# Patient Record
Sex: Female | Born: 1980 | Race: Black or African American | Hispanic: No | Marital: Married | Smoking: Never smoker
Health system: Southern US, Community
[De-identification: ages and names within clinical notes are randomized; demographics above are authoritative.]

## PROBLEM LIST (undated history)

## (undated) ENCOUNTER — Inpatient Hospital Stay (HOSPITAL_COMMUNITY): Payer: Self-pay

## (undated) DIAGNOSIS — R011 Cardiac murmur, unspecified: Secondary | ICD-10-CM

## (undated) DIAGNOSIS — IMO0002 Reserved for concepts with insufficient information to code with codable children: Secondary | ICD-10-CM

## (undated) DIAGNOSIS — Z789 Other specified health status: Secondary | ICD-10-CM

## (undated) DIAGNOSIS — G43709 Chronic migraine without aura, not intractable, without status migrainosus: Secondary | ICD-10-CM

## (undated) DIAGNOSIS — H579 Unspecified disorder of eye and adnexa: Secondary | ICD-10-CM

## (undated) HISTORY — PX: NO PAST SURGERIES: SHX2092

---
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
## 2001-11-29 ENCOUNTER — Emergency Department (HOSPITAL_COMMUNITY): Admission: EM | Admit: 2001-11-29 | Discharge: 2001-11-29 | Payer: Self-pay | Admitting: Emergency Medicine

## 2002-10-29 ENCOUNTER — Emergency Department (HOSPITAL_COMMUNITY): Admission: EM | Admit: 2002-10-29 | Discharge: 2002-10-29 | Payer: Self-pay | Admitting: Emergency Medicine

## 2004-01-06 ENCOUNTER — Emergency Department (HOSPITAL_COMMUNITY): Admission: EM | Admit: 2004-01-06 | Discharge: 2004-01-06 | Payer: Self-pay | Admitting: Emergency Medicine

## 2005-02-11 ENCOUNTER — Emergency Department (HOSPITAL_COMMUNITY): Admission: EM | Admit: 2005-02-11 | Discharge: 2005-02-11 | Payer: Self-pay | Admitting: Emergency Medicine

## 2005-02-15 ENCOUNTER — Emergency Department (HOSPITAL_COMMUNITY): Admission: EM | Admit: 2005-02-15 | Discharge: 2005-02-15 | Payer: Self-pay | Admitting: Emergency Medicine

## 2005-02-18 ENCOUNTER — Emergency Department (HOSPITAL_COMMUNITY): Admission: EM | Admit: 2005-02-18 | Discharge: 2005-02-18 | Payer: Self-pay | Admitting: Family Medicine

## 2005-05-29 ENCOUNTER — Emergency Department (HOSPITAL_COMMUNITY): Admission: EM | Admit: 2005-05-29 | Discharge: 2005-05-29 | Payer: Self-pay | Admitting: Family Medicine

## 2007-03-18 ENCOUNTER — Emergency Department (HOSPITAL_COMMUNITY): Admission: EM | Admit: 2007-03-18 | Discharge: 2007-03-18 | Payer: Self-pay | Admitting: *Deleted

## 2010-06-28 ENCOUNTER — Emergency Department (HOSPITAL_COMMUNITY): Admission: EM | Admit: 2010-06-28 | Discharge: 2010-06-29 | Payer: Self-pay | Admitting: Emergency Medicine

## 2010-06-29 ENCOUNTER — Emergency Department (HOSPITAL_COMMUNITY): Admission: EM | Admit: 2010-06-29 | Discharge: 2010-06-30 | Payer: Self-pay | Admitting: Emergency Medicine

## 2012-05-15 ENCOUNTER — Encounter (HOSPITAL_COMMUNITY): Payer: Self-pay | Admitting: Family Medicine

## 2012-05-15 ENCOUNTER — Emergency Department (HOSPITAL_COMMUNITY)
Admission: EM | Admit: 2012-05-15 | Discharge: 2012-05-15 | Disposition: A | Discharge status: Home / Self Care | Payer: Self-pay | Attending: Emergency Medicine | Admitting: Emergency Medicine

## 2012-05-15 DIAGNOSIS — R112 Nausea with vomiting, unspecified: Secondary | ICD-10-CM | POA: Insufficient documentation

## 2012-05-15 DIAGNOSIS — R109 Unspecified abdominal pain: Secondary | ICD-10-CM

## 2012-05-15 DIAGNOSIS — N390 Urinary tract infection, site not specified: Secondary | ICD-10-CM

## 2012-05-15 DIAGNOSIS — R630 Anorexia: Secondary | ICD-10-CM | POA: Insufficient documentation

## 2012-05-15 DIAGNOSIS — B3741 Candidal cystitis and urethritis: Secondary | ICD-10-CM

## 2012-05-15 DIAGNOSIS — R1013 Epigastric pain: Secondary | ICD-10-CM | POA: Insufficient documentation

## 2012-05-15 DIAGNOSIS — R1012 Left upper quadrant pain: Secondary | ICD-10-CM | POA: Insufficient documentation

## 2012-05-15 LAB — COMPREHENSIVE METABOLIC PANEL
ALT: 11 U/L (ref 0–35)
Alkaline Phosphatase: 70 U/L (ref 39–117)
CO2: 26 mEq/L (ref 19–32)
Chloride: 103 mEq/L (ref 96–112)
GFR calc Af Amer: >90 mL/min (ref >90)
Glucose, Bld: 76 mg/dL (ref 70–99)
Potassium: 4.2 mEq/L (ref 3.5–5.1)
Sodium: 138 mEq/L (ref 135–145)
Total Bilirubin: 0.3 mg/dL (ref 0.3–1.2)
Total Protein: 7.7 g/dL (ref 6.0–8.3)

## 2012-05-15 LAB — CBC WITH DIFFERENTIAL/PLATELET
Eosinophils Absolute: 0.2 10*3/uL (ref 0.0–0.7)
Hemoglobin: 13.9 g/dL (ref 12.0–15.0)
Lymphocytes Relative: 39 % (ref 12–46)
Lymphs Abs: 2.2 10*3/uL (ref 0.7–4.0)
Monocytes Relative: 9 % (ref 3–12)
Neutro Abs: 2.7 10*3/uL (ref 1.7–7.7)
Neutrophils Relative %: 48 % (ref 43–77)
Platelets: 272 10*3/uL (ref 150–400)
RBC: 4.35 MIL/uL (ref 3.87–5.11)
WBC: 5.6 10*3/uL (ref 4.0–10.5)

## 2012-05-15 LAB — URINE MICROSCOPIC-ADD ON

## 2012-05-15 LAB — WET PREP, GENITAL

## 2012-05-15 LAB — URINALYSIS, ROUTINE W REFLEX MICROSCOPIC
Glucose, UA: NEGATIVE mg/dL
Ketones, ur: 40 mg/dL — AB
Protein, ur: >300 mg/dL — AB
Urobilinogen, UA: 1 mg/dL (ref 0.0–1.0)

## 2012-05-15 MED ORDER — ONDANSETRON HCL 4 MG PO TABS: 4.0000 mg | ORAL_TABLET | Freq: Four times a day (QID) | ORAL | Status: DC

## 2012-05-15 MED ORDER — NITROFURANTOIN MONOHYD MACRO 100 MG PO CAPS: 100.0000 mg | ORAL_CAPSULE | Freq: Two times a day (BID) | ORAL | Status: DC

## 2012-05-15 MED ORDER — NITROFURANTOIN MONOHYD MACRO 100 MG PO CAPS: 100.0000 mg | ORAL_CAPSULE | Freq: Two times a day (BID) | ORAL | Status: AC

## 2012-05-15 MED ORDER — FLUCONAZOLE 150 MG PO TABS
150.0000 mg | ORAL_TABLET | Freq: Once | ORAL | Status: AC
  Administered 2012-05-15: 150 mg via ORAL
  Filled 2012-05-15: qty 1

## 2012-05-15 MED ORDER — ONDANSETRON HCL 4 MG PO TABS: 4.0000 mg | ORAL_TABLET | Freq: Four times a day (QID) | ORAL | Status: AC

## 2012-05-15 NOTE — ED Notes (Signed)
Pt sts abdominal pain since this am. sts located at the top of her abdomen associated with N,V. sts hasnt had a normal period in a few months.

## 2012-05-15 NOTE — ED Notes (Signed)
Discharge instructions and prescriptions given  Voiced understanding. 

## 2012-05-15 NOTE — ED Provider Notes (Signed)
History     CSN: [id number]  Arrival date & time 05/15/12  1315   First MD Initiated Contact with Patient 05/15/12 1727      Chief Complaint  Patient presents with  . Abdominal Pain    (Consider location/radiation/quality/duration/timing/severity/associated sxs/prior treatment) Patient is a 31 y.o. female presenting with abdominal pain.  Abdominal Pain The primary symptoms of the illness include abdominal pain, nausea, vomiting and vaginal bleeding. The primary symptoms of the illness do not include diarrhea, dysuria or vaginal discharge. The current episode started yesterday. The onset of the illness was gradual. The problem has not changed since onset. The abdominal pain is located in the LUQ and epigastric region. The abdominal pain does not radiate. The severity of the abdominal pain is 6/10. The abdominal pain is relieved by nothing. The abdominal pain is exacerbated by eating.  Associated with: no EtOH use. The patient states that she believes she is currently not pregnant. The patient has not had a change in bowel habit. Additional symptoms associated with the illness include anorexia. Symptoms associated with the illness do not include chills, constipation, urgency, frequency or back pain. Significant associated medical issues do not include GERD, diabetes or gallstones.    History reviewed. No pertinent past medical history.  History reviewed. No pertinent past surgical history.  History reviewed. No pertinent family history.  History  Substance Use Topics  . Smoking status: Not on file  . Smokeless tobacco: Not on file  . Alcohol Use:     OB History    Grav Para Term Preterm Abortions TAB SAB Ect Mult Living                  Review of Systems  Constitutional: Negative for chills.  Gastrointestinal: Positive for nausea, vomiting, abdominal pain and anorexia. Negative for diarrhea and constipation.  Genitourinary: Positive for vaginal bleeding. Negative for  dysuria, urgency, frequency and vaginal discharge.  Musculoskeletal: Negative for back pain.  All other systems reviewed and are negative.    Allergies  Penicillins  Home Medications  No current outpatient prescriptions on file.  BP 106/61  Pulse 76  Temp 98 F (36.7 C) (Oral)  Resp 15  SpO2 99%  LMP 05/15/2012  Physical Exam  Nursing note and vitals reviewed. Constitutional: She is oriented to person, place, and time. She appears well-developed and well-nourished. No distress.  HENT:  Head: Normocephalic and atraumatic.  Mouth/Throat: Oropharynx is clear and moist.  Eyes: Conjunctivae and EOM are normal. Pupils are equal, round, and reactive to light.  Neck: Normal range of motion. Neck supple.  Cardiovascular: Normal rate, regular rhythm and intact distal pulses.   No murmur heard. Pulmonary/Chest: Effort normal and breath sounds normal. No respiratory distress. She has no wheezes. She has no rales.  Abdominal: Soft. Normal appearance. She exhibits no distension. There is tenderness in the epigastric area and left upper quadrant. There is no rebound, no guarding and no CVA tenderness. No hernia.    Genitourinary: Uterus normal. Cervix exhibits no motion tenderness, no discharge and no friability. Right adnexum displays no mass and no tenderness. Left adnexum displays no mass and no tenderness. There is bleeding around the vagina. No tenderness around the vagina. No vaginal discharge found.  Musculoskeletal: Normal range of motion. She exhibits no edema and no tenderness.  Neurological: She is alert and oriented to person, place, and time.  Skin: Skin is warm and dry. No rash noted. No erythema.  Psychiatric: She has a normal mood  and affect. Her behavior is normal.    ED Course  Procedures (including critical care time)  Labs Reviewed  URINALYSIS, ROUTINE W REFLEX MICROSCOPIC - Abnormal; Notable for the following:    Color, Urine RED (*)  BIOCHEMICALS MAY BE AFFECTED  BY COLOR   APPearance TURBID (*)     Hgb urine dipstick LARGE (*)     Bilirubin Urine LARGE (*)     Ketones, ur 40 (*)     Protein, ur >300 (*)     Nitrite POSITIVE (*)     Leukocytes, UA MODERATE (*)     All other components within normal limits  URINE MICROSCOPIC-ADD ON - Abnormal; Notable for the following:    Squamous Epithelial / LPF MANY (*)     Bacteria, UA MANY (*)     All other components within normal limits  COMPREHENSIVE METABOLIC PANEL - Abnormal; Notable for the following:    GFR calc non Af Amer 79 (*)     All other components within normal limits  WET PREP, GENITAL - Abnormal; Notable for the following:    Yeast Wet Prep HPF POC FEW (*)     Clue Cells Wet Prep HPF POC FEW (*)     All other components within normal limits  POCT PREGNANCY, URINE  CBC WITH DIFFERENTIAL  LIPASE, BLOOD  GC/CHLAMYDIA PROBE AMP, GENITAL   No results found.   No diagnosis found.    MDM   Patient with a history of left-sided abdominal pain that started last night. She denies any dysuria but also started her menses yesterday. She's had 2 episodes of vomiting and nausea however the nausea starting to improve. On exam she has left mid and upper quadrant pain. There's no suprapubic tenderness or CVA tenderness. He is contaminated but has positive nitrites and leukocytes as well as positive yeast. Will get CBC, CMP and lipase. Patient states she does not drink alcohol heavily and has not had any alcohol in the last 36 hours. Also will do pelvic exam to further evaluate for pelvic infection. UPT is negative  Labs all wnl.  Wet prep neg.  No CMT or adnexal tenderness on exam.  Due to yeast and infection in the urine will treat.      Gwyneth Sprout, MD 05/15/12 1840

## 2012-05-16 LAB — GC/CHLAMYDIA PROBE AMP, GENITAL: Chlamydia, DNA Probe: NEGATIVE

## 2013-04-01 ENCOUNTER — Emergency Department (HOSPITAL_COMMUNITY)
Admission: EM | Admit: 2013-04-01 | Discharge: 2013-04-01 | Disposition: A | Discharge status: Home / Self Care | Payer: Self-pay | Attending: Emergency Medicine | Admitting: Emergency Medicine

## 2013-04-01 ENCOUNTER — Encounter (HOSPITAL_COMMUNITY): Payer: Self-pay | Admitting: Emergency Medicine

## 2013-04-01 DIAGNOSIS — K0889 Other specified disorders of teeth and supporting structures: Secondary | ICD-10-CM

## 2013-04-01 DIAGNOSIS — K089 Disorder of teeth and supporting structures, unspecified: Secondary | ICD-10-CM | POA: Insufficient documentation

## 2013-04-01 DIAGNOSIS — Z880 Allergy status to penicillin: Secondary | ICD-10-CM | POA: Insufficient documentation

## 2013-04-01 MED ORDER — HYDROCODONE-ACETAMINOPHEN 5-325 MG PO TABS
ORAL_TABLET | ORAL | Status: DC
Start: 2013-04-01 — End: 2013-07-16

## 2013-04-01 MED ORDER — IBUPROFEN 600 MG PO TABS: 600.0000 mg | ORAL_TABLET | Freq: Four times a day (QID) | ORAL | Status: DC | PRN

## 2013-04-01 NOTE — ED Notes (Signed)
Pt c/o right sided dental pain x 1 week

## 2013-04-01 NOTE — ED Provider Notes (Signed)
History     CSN: [id number]  Arrival date & time 04/01/13  0809   First MD Initiated Contact with Patient 04/01/13 0915      Chief Complaint  Patient presents with  . Dental Pain    (Consider location/radiation/quality/duration/timing/severity/associated sxs/prior treatment) HPI Comments: Patient presents with one-week history of upper and lower dental pain on the left side of her mouth. No neck swelling or fever. No trouble with chewing or swallowing. Cold water makes the pain worse. Nothing makes it better. Patient has tried several over-the-counter medications. Onset gradual. Course is constant.  Patient is a 32 y.o. female presenting with tooth pain. The history is provided by the patient.  Dental PainPrimary symptoms do not include headaches, fever, shortness of breath or sore throat.  Additional symptoms do not include: facial swelling, trouble swallowing and ear pain.    History reviewed. No pertinent past medical history.  History reviewed. No pertinent past surgical history.  History reviewed. No pertinent family history.  History  Substance Use Topics  . Smoking status: Never Smoker   . Smokeless tobacco: Not on file  . Alcohol Use: Yes     Comment: occ    OB History   Grav Para Term Preterm Abortions TAB SAB Ect Mult Living                  Review of Systems  Constitutional: Negative for fever.  HENT: Positive for dental problem. Negative for ear pain, sore throat, facial swelling, trouble swallowing and neck pain.   Respiratory: Negative for shortness of breath and stridor.   Skin: Negative for color change.  Neurological: Negative for headaches.    Allergies  Penicillins  Home Medications   Current Outpatient Rx  Name  Route  Sig  Dispense  Refill  . acetaminophen (TYLENOL) 500 MG tablet   Oral   Take 1,000 mg by mouth every 6 (six) hours as needed for pain.         . Aspirin-Acetaminophen-Caffeine (GOODYS EXTRA STRENGTH PO)   Oral   Take  1 packet by mouth 2 (two) times daily as needed (pain).         . naproxen sodium (ANAPROX) 220 MG tablet   Oral   Take 220 mg by mouth once as needed (pain).         Marland Kitchen HYDROcodone-acetaminophen (NORCO/VICODIN) 5-325 MG per tablet      Take 1-2 tablets every 6 hours as needed for severe pain   12 tablet   0   . ibuprofen (ADVIL,MOTRIN) 600 MG tablet   Oral   Take 1 tablet (600 mg total) by mouth every 6 (six) hours as needed for pain.   20 tablet   0     BP 147/85  Pulse 50  Resp 20  SpO2 100%  Physical Exam  Nursing note and vitals reviewed. Constitutional: She appears well-developed and well-nourished.  HENT:  Head: Normocephalic and atraumatic. No trismus in the jaw.  Right Ear: Tympanic membrane, external ear and ear canal normal.  Left Ear: Tympanic membrane, external ear and ear canal normal.  Nose: Nose normal.  Mouth/Throat: Uvula is midline, oropharynx is clear and moist and mucous membranes are normal. Abnormal dentition. Dental caries present. No dental abscesses or edematous. No tonsillar abscesses.  No swelling or erythema noted on exam. Pain with palpation at base of upper right third molar. Lower right third molar is absent however there is tenderness in this area as well. No gross abscess.  Eyes: Conjunctivae are normal.  Neck: Normal range of motion. Neck supple.  No neck swelling or Ludwig's angina  Lymphadenopathy:    She has no cervical adenopathy.  Neurological: She is alert.  Skin: Skin is warm and dry.  Psychiatric: She has a normal mood and affect.    ED Course  Procedures (including critical care time)  Labs Reviewed - No data to display No results found.   1. Pain, dental    9:59 AM Patient seen and examined.    Vital signs reviewed and are as follows: Filed Vitals:   04/01/13 0825  BP: 147/85  Pulse: 50  Resp: 20    Patient counseled to take prescribed medications as directed, return with worsening facial or neck swelling,  and to follow-up with their dentist as soon as possible.   Patient counseled on use of narcotic pain medications. Counseled not to combine these medications with others containing tylenol. Urged not to drink alcohol, drive, or perform any other activities that requires focus while taking these medications. The patient verbalizes understanding and agrees with the plan.    MDM  Patient with toothache.  No gross abscess.  Exam unconcerning for Ludwig's angina or other deep tissue infection in neck.  Will treat with pain medicine.  Urged patient to follow-up with dentist.  Referrals given.          Renne Crigler, PA-C 04/01/13 1001

## 2013-04-01 NOTE — ED Provider Notes (Signed)
Medical screening examination/treatment/procedure(s) were performed by non-physician practitioner and as supervising physician I was immediately available for consultation/collaboration.  Corissa Oguinn L Kensley Lares, MD 04/01/13 1634 

## 2013-07-16 ENCOUNTER — Emergency Department (HOSPITAL_COMMUNITY)
Admission: EM | Admit: 2013-07-16 | Discharge: 2013-07-16 | Disposition: A | Discharge status: Home / Self Care | Payer: Self-pay | Attending: Emergency Medicine | Admitting: Emergency Medicine

## 2013-07-16 ENCOUNTER — Encounter (HOSPITAL_COMMUNITY): Payer: Self-pay | Admitting: Emergency Medicine

## 2013-07-16 ENCOUNTER — Emergency Department (HOSPITAL_COMMUNITY): Payer: Self-pay

## 2013-07-16 DIAGNOSIS — Z3401 Encounter for supervision of normal first pregnancy, first trimester: Secondary | ICD-10-CM

## 2013-07-16 DIAGNOSIS — N898 Other specified noninflammatory disorders of vagina: Secondary | ICD-10-CM | POA: Insufficient documentation

## 2013-07-16 DIAGNOSIS — Z880 Allergy status to penicillin: Secondary | ICD-10-CM | POA: Insufficient documentation

## 2013-07-16 DIAGNOSIS — Z3400 Encounter for supervision of normal first pregnancy, unspecified trimester: Secondary | ICD-10-CM | POA: Insufficient documentation

## 2013-07-16 DIAGNOSIS — R109 Unspecified abdominal pain: Secondary | ICD-10-CM | POA: Insufficient documentation

## 2013-07-16 DIAGNOSIS — N949 Unspecified condition associated with female genital organs and menstrual cycle: Secondary | ICD-10-CM | POA: Insufficient documentation

## 2013-07-16 DIAGNOSIS — O9989 Other specified diseases and conditions complicating pregnancy, childbirth and the puerperium: Secondary | ICD-10-CM | POA: Insufficient documentation

## 2013-07-16 LAB — CBC WITH DIFFERENTIAL/PLATELET
Basophils Absolute: 0 10*3/uL (ref 0.0–0.1)
Eosinophils Absolute: 0.1 10*3/uL (ref 0.0–0.7)
Eosinophils Relative: 2 % (ref 0–5)
Lymphocytes Relative: 19 % (ref 12–46)
MCH: 32.3 pg (ref 26.0–34.0)
MCV: 88.7 fL (ref 78.0–100.0)
Platelets: 266 10*3/uL (ref 150–400)
RDW: 12.4 % (ref 11.5–15.5)
WBC: 7.7 10*3/uL (ref 4.0–10.5)

## 2013-07-16 LAB — URINALYSIS, ROUTINE W REFLEX MICROSCOPIC
Ketones, ur: 15 mg/dL — AB
Leukocytes, UA: NEGATIVE
Nitrite: NEGATIVE
Protein, ur: NEGATIVE mg/dL
pH: 6 (ref 5.0–8.0)

## 2013-07-16 LAB — BASIC METABOLIC PANEL
Calcium: 9 mg/dL (ref 8.4–10.5)
GFR calc non Af Amer: >90 mL/min (ref >90)
Sodium: 132 mEq/L — ABNORMAL LOW (ref 135–145)

## 2013-07-16 LAB — POCT PREGNANCY, URINE: Preg Test, Ur: POSITIVE — AB

## 2013-07-16 LAB — WET PREP, GENITAL: Yeast Wet Prep HPF POC: NONE SEEN

## 2013-07-16 MED ORDER — COMPLETENATE 29-1 MG PO CHEW: 1.0000 | CHEWABLE_TABLET | Freq: Every day | ORAL | Status: DC

## 2013-07-16 NOTE — ED Provider Notes (Signed)
CSN: [id number]     Arrival date & time 07/16/13  1048 History   First MD Initiated Contact with Patient 07/16/13 1129     Chief Complaint  Patient presents with  . Abdominal Pain  . Vaginal Discharge   (Consider location/radiation/quality/duration/timing/severity/associated sxs/prior Treatment) Patient is a 32 y.o. female presenting with abdominal pain and vaginal discharge. The history is provided by the patient. No language interpreter was used.  Abdominal Pain Pain location:  L flank Associated symptoms: vaginal discharge   Associated symptoms: no chest pain, no dysuria, no fever, no nausea, no shortness of breath, no vaginal bleeding and no vomiting   Associated symptoms comment:  She presents with left flank pain since yesterday without dysuria, hematuria or frequency. No fever, N, V. She reports recent positive pregnancy test and she was concerned regarding the pregnancy. She is having a white vaginal discharge that appears to have a "pinkish" tint" at times, but denies vaginal or pelvic pain or frank bleed.  Vaginal Discharge Associated symptoms: no abdominal pain, no dysuria, no fever, no nausea and no vomiting     History reviewed. No pertinent past medical history. History reviewed. No pertinent past surgical history. History reviewed. No pertinent family history. History  Substance Use Topics  . Smoking status: Never Smoker   . Smokeless tobacco: Not on file  . Alcohol Use: Yes     Comment: occ   OB History   Grav Para Term Preterm Abortions TAB SAB Ect Mult Living                 Review of Systems  Constitutional: Negative for fever.  Respiratory: Negative for shortness of breath.   Cardiovascular: Negative for chest pain.  Gastrointestinal: Negative for nausea, vomiting and abdominal pain.  Genitourinary: Positive for flank pain and vaginal discharge. Negative for dysuria, vaginal bleeding and pelvic pain.  Musculoskeletal: Negative for myalgias.    Allergies   Penicillins  Home Medications   Current Outpatient Rx  Name  Route  Sig  Dispense  Refill  . acetaminophen (TYLENOL) 500 MG tablet   Oral   Take 1,000 mg by mouth every 6 (six) hours as needed for pain.         . Multiple Vitamins-Minerals (MULTIVITAMIN WITH MINERALS) tablet   Oral   Take 1 tablet by mouth daily.          BP 116/66  Pulse 50  Temp(Src) 98.5 F (36.9 C) (Oral)  Resp 18  SpO2 100% Physical Exam  Constitutional: She is oriented to person, place, and time. She appears well-developed and well-nourished.  HENT:  Head: Normocephalic.  Neck: Normal range of motion. Neck supple.  Cardiovascular: Normal rate and regular rhythm.   Pulmonary/Chest: Effort normal and breath sounds normal.  Abdominal: Soft. Bowel sounds are normal. There is no tenderness. There is no rebound and no guarding.  Genitourinary: Uterus normal. Vaginal discharge found.  No adnexal mass or tenderness. White vaginal discharge without malodor. No CMT.  Musculoskeletal: Normal range of motion.  Neurological: She is alert and oriented to person, place, and time.  Skin: Skin is warm and dry. No rash noted.  Psychiatric: She has a normal mood and affect.    ED Course  Procedures (including critical care time) Labs Review Labs Reviewed  URINALYSIS, ROUTINE W REFLEX MICROSCOPIC - Abnormal; Notable for the following:    Ketones, ur 15 (*)    All other components within normal limits  PREGNANCY, URINE - Abnormal; Notable for the  following:    Preg Test, Ur POSITIVE (*)    All other components within normal limits  CBC WITH DIFFERENTIAL - Abnormal; Notable for the following:    MCHC 36.4 (*)    All other components within normal limits  BASIC METABOLIC PANEL - Abnormal; Notable for the following:    Sodium 132 (*)    Glucose, Bld 111 (*)    All other components within normal limits  POCT PREGNANCY, URINE - Abnormal; Notable for the following:    Preg Test, Ur POSITIVE (*)    All other  components within normal limits  WET PREP, GENITAL  GC/CHLAMYDIA PROBE AMP  HCG, QUANTITATIVE, PREGNANCY   Results for orders placed during the hospital encounter of 07/16/13  WET PREP, GENITAL      Result Value Range   Yeast Wet Prep HPF POC NONE SEEN  NONE SEEN   Trich, Wet Prep NONE SEEN  NONE SEEN   Clue Cells Wet Prep HPF POC NONE SEEN  NONE SEEN   WBC, Wet Prep HPF POC FEW (*) NONE SEEN  GC/CHLAMYDIA PROBE AMP      Result Value Range   CT Probe RNA NEGATIVE  NEGATIVE   GC Probe RNA NEGATIVE  NEGATIVE  URINALYSIS, ROUTINE W REFLEX MICROSCOPIC      Result Value Range   Color, Urine YELLOW  YELLOW   APPearance CLEAR  CLEAR   Specific Gravity, Urine 1.028  1.005 - 1.030   pH 6.0  5.0 - 8.0   Glucose, UA NEGATIVE  NEGATIVE mg/dL   Hgb urine dipstick NEGATIVE  NEGATIVE   Bilirubin Urine NEGATIVE  NEGATIVE   Ketones, ur 15 (*) NEGATIVE mg/dL   Protein, ur NEGATIVE  NEGATIVE mg/dL   Urobilinogen, UA 0.2  0.0 - 1.0 mg/dL   Nitrite NEGATIVE  NEGATIVE   Leukocytes, UA NEGATIVE  NEGATIVE  PREGNANCY, URINE      Result Value Range   Preg Test, Ur POSITIVE (*) NEGATIVE  HCG, QUANTITATIVE, PREGNANCY      Result Value Range   hCG, Beta Chain, Quant, S 64520 (*) <5 mIU/mL  CBC WITH DIFFERENTIAL      Result Value Range   WBC 7.7  4.0 - 10.5 K/uL   RBC 4.06  3.87 - 5.11 MIL/uL   Hemoglobin 13.1  12.0 - 15.0 g/dL   HCT 16.1  09.6 - 04.5 %   MCV 88.7  78.0 - 100.0 fL   MCH 32.3  26.0 - 34.0 pg   MCHC 36.4 (*) 30.0 - 36.0 g/dL   RDW 40.9  81.1 - 91.4 %   Platelets 266  150 - 400 K/uL   Neutrophils Relative % 73  43 - 77 %   Neutro Abs 5.6  1.7 - 7.7 K/uL   Lymphocytes Relative 19  12 - 46 %   Lymphs Abs 1.5  0.7 - 4.0 K/uL   Monocytes Relative 6  3 - 12 %   Monocytes Absolute 0.5  0.1 - 1.0 K/uL   Eosinophils Relative 2  0 - 5 %   Eosinophils Absolute 0.1  0.0 - 0.7 K/uL   Basophils Relative 0  0 - 1 %   Basophils Absolute 0.0  0.0 - 0.1 K/uL  BASIC METABOLIC PANEL       Result Value Range   Sodium 132 (*) 135 - 145 mEq/L   Potassium 3.8  3.5 - 5.1 mEq/L   Chloride 101  96 - 112 mEq/L   CO2 23  19 - 32 mEq/L   Glucose, Bld 111 (*) 70 - 99 mg/dL   BUN 11  6 - 23 mg/dL   Creatinine, Ser 1.61  0.50 - 1.10 mg/dL   Calcium 9.0  8.4 - 09.6 mg/dL   GFR calc non Af Amer >90  >90 mL/min   GFR calc Af Amer >90  >90 mL/min  POCT PREGNANCY, URINE      Result Value Range   Preg Test, Ur POSITIVE (*) NEGATIVE   US Ob Comp Less 14 Wks  07/16/2013   *RADIOLOGY REPORT*  Clinical Data: Abdominal pain, vaginal discharge.  OBSTETRIC <14 WK [place] AND TRANSVAGINAL OB US  Technique:  Both transabdominal and transvaginal ultrasound examinations were performed for complete evaluation of the gestation as well as the maternal uterus, adnexal regions, and pelvic cul-de-sac.  Transvaginal technique was performed to assess early pregnancy.  Comparison:  None.  Intrauterine gestational sac:  Single. Yolk sac: Present. Embryo: Present. Cardiac Activity: Present. Heart Rate: 152 bpm  CRL: 11.9  mm  7 w  3 d             [place] EDC: 03/01/2014.  Maternal uterus/adnexae: No evidence of subchorionic hemorrhage.  Probable corpus luteum cyst in the right ovary, measuring 1.5 cm.  Left ovary is unremarkable.  No free fluid.  A 4.0 cm fibroid is seen in the uterine fundus.  IMPRESSION:  1.  Single living intrauterine pregnancy with gestational age of [redacted] weeks 3 days and estimated confinement 03/01/2014.  No complicating features. 2.  Uterine fibroid.   Original Report Authenticated By: Leanna Battles, M.D.   US Ob Transvaginal  07/16/2013   *RADIOLOGY REPORT*  Clinical Data: Abdominal pain, vaginal discharge.  OBSTETRIC <14 WK [place] AND TRANSVAGINAL OB US  Technique:  Both transabdominal and transvaginal ultrasound examinations were performed for complete evaluation of the gestation as well as the maternal uterus, adnexal regions, and pelvic cul-de-sac.  Transvaginal technique was performed to assess early  pregnancy.  Comparison:  None.  Intrauterine gestational sac:  Single. Yolk sac: Present. Embryo: Present. Cardiac Activity: Present. Heart Rate: 152 bpm  CRL: 11.9  mm  7 w  3 d             [place] EDC: 03/01/2014.  Maternal uterus/adnexae: No evidence of subchorionic hemorrhage.  Probable corpus luteum cyst in the right ovary, measuring 1.5 cm.  Left ovary is unremarkable.  No free fluid.  A 4.0 cm fibroid is seen in the uterine fundus.  IMPRESSION:  1.  Single living intrauterine pregnancy with gestational age of [redacted] weeks 3 days and estimated confinement 03/01/2014.  No complicating features. 2.  Uterine fibroid.   Original Report Authenticated By: Leanna Battles, M.D.   Imaging Review No results found.  MDM  No diagnosis found. 1. IUP 2. Vaginal discharge, w/o infection Living IUP confirmed with [place]. Patient is in NAD, VSS. Started on prenatal vitamins and referred to OB at [facility]. Vaginal discharge likely normal physiology - no evidence infection.    Arnoldo Hooker, PA-C 07/18/13 1542

## 2013-07-16 NOTE — ED Notes (Signed)
Pt sts took home pregnancy test that was positive; pt sts LMP was 7/11; pt c/o lower left sided abd pain and some white vaginal discharge; pt G1

## 2013-07-16 NOTE — ED Notes (Signed)
Set up pelvic cart outside of pts room

## 2013-07-17 LAB — GC/CHLAMYDIA PROBE AMP: CT Probe RNA: NEGATIVE

## 2013-07-18 NOTE — ED Provider Notes (Addendum)
Medical screening examination/treatment/procedure(s) were performed by non-physician practitioner and as supervising physician I was immediately available for consultation/collaboration.   Claudean Kinds, MD 07/18/13 0725  Claudean Kinds, MD 07/20/13 [phone]

## 2013-07-20 NOTE — ED Provider Notes (Signed)
Medical screening examination/treatment/procedure(s) were performed by non-physician practitioner and as supervising physician I was immediately available for consultation/collaboration.   Claudean Kinds, MD 07/20/13 [phone]

## 2013-08-19 ENCOUNTER — Ambulatory Visit (INDEPENDENT_AMBULATORY_CARE_PROVIDER_SITE_OTHER): Payer: Medicaid Other | Admitting: Advanced Practice Midwife

## 2013-08-19 ENCOUNTER — Encounter: Payer: Self-pay | Admitting: Advanced Practice Midwife

## 2013-08-19 VITALS — BP 124/82 | Temp 98.5°F | Ht 66.0 in | Wt 170.4 lb

## 2013-08-19 DIAGNOSIS — Z3400 Encounter for supervision of normal first pregnancy, unspecified trimester: Secondary | ICD-10-CM | POA: Insufficient documentation

## 2013-08-19 DIAGNOSIS — Z87898 Personal history of other specified conditions: Secondary | ICD-10-CM

## 2013-08-19 DIAGNOSIS — D259 Leiomyoma of uterus, unspecified: Secondary | ICD-10-CM

## 2013-08-19 DIAGNOSIS — Z3201 Encounter for pregnancy test, result positive: Secondary | ICD-10-CM

## 2013-08-19 DIAGNOSIS — Z8742 Personal history of other diseases of the female genital tract: Secondary | ICD-10-CM

## 2013-08-19 LAB — POCT URINALYSIS DIPSTICK
Blood, UA: NEGATIVE
Glucose, UA: NEGATIVE
Ketones, UA: NEGATIVE
Spec Grav, UA: 1.01

## 2013-08-19 MED ORDER — NEXA SELECT 29-1.25-325 MG PO CAPS: 1.0000 | ORAL_CAPSULE | Freq: Every day | ORAL | Status: DC

## 2013-08-19 NOTE — Progress Notes (Signed)
Pulse-  45 . Subjective:    Kaetlin Bullen is being seen today for her first obstetrical visit.  This is not a planned pregnancy. She is at [redacted]w[redacted]d gestation. Her obstetrical history is significant for NA. Relationship with FOB: significant other, living together. Patient does intend to breast feed. Pregnancy history fully reviewed.  Menstrual History: OB History   Grav Para Term Preterm Abortions TAB SAB Ect Mult Living   1               Menarche age: 32  Patient's last menstrual period was 05/24/2013.   Patient has a history of abnormal pap w/ out f/u.   The following portions of the patient's history were reviewed and updated as appropriate: allergies, current medications, past family history, past medical history, past social history, past surgical history and problem list.  Review of Systems A comprehensive review of systems was negative.    Objective:    BP 124/82  Temp(Src) 98.5 F (36.9 C)  Ht 5\' 6"  (1.676 m)  Wt 170 lb 6.4 oz (77.293 kg)  BMI 27.52 kg/m2  LMP 05/24/2013  General Appearance:    Alert, cooperative, no distress, appears stated age  Head:    Normocephalic, without obvious abnormality, atraumatic  Eyes:    PERRL, conjunctiva/corneas clear, EOM's intact, fundi    benign, both eyes  Ears:    Normal TM's and external ear canals, both ears  Nose:   Nares normal, septum midline, mucosa normal, no drainage    or sinus tenderness  Throat:   Lips, mucosa, and tongue normal; teeth and gums normal  Neck:   Supple, symmetrical, trachea midline, no adenopathy;    thyroid:  no enlargement/tenderness/nodules; no carotid   bruit or JVD  Back:     Symmetric, no curvature, ROM normal, no CVA tenderness  Lungs:     Clear to auscultation bilaterally, respirations unlabored  Chest Wall:    No tenderness or deformity   Heart:    Regular rate and rhythm, S1 and S2 normal, no murmur, rub   or gallop  Breast Exam:    No tenderness, masses, or nipple abnormality   Abdomen:     Soft, non-tender, bowel sounds active all four quadrants,    no masses, no organomegaly  Genitalia:    Normal female without lesion, discharge or tenderness  Rectal:    Normal tone, normal prostate, no masses or tenderness;   guaiac negative stool  Extremities:   Extremities normal, atraumatic, no cyanosis or edema  Pulses:   2+ and symmetric all extremities  Skin:   Skin color, texture, turgor normal, no rashes or lesions  Lymph nodes:   Cervical, supraclavicular, and axillary nodes normal  Neurologic:   CNII-XII intact, normal strength, sensation and reflexes    throughout      Assessment:    Pregnancy at [redacted]w[redacted]d weeks  Patient Active Problem List   Diagnosis Date Noted  . Supervision of normal first pregnancy 08/19/2013  . History of abnormal Pap smear 08/19/2013  . Uterine fibroid 08/19/2013      Plan:    Initial labs drawn. Prenatal vitamins.  Counseling provided regarding continued use of seat belts, cessation of alcohol consumption, smoking or use of illicit drugs; infection precautions i.e., influenza/TDAP immunizations, toxoplasmosis,CMV, parvovirus, listeria and varicella; workplace safety, exercise during pregnancy; routine dental care, safe medications, sexual activity, hot tubs, saunas, pools, travel, caffeine use, fish and methlymercury, potential toxins, hair treatments, varicose veins Weight gain recommendations reviewed: underweight/BMI< 18.5-->  gain 28 - 40 lbs; normal weight/BMI 18.5 - 24.9--> gain 25 - 35 lbs; overweight/BMI 25 - 29.9--> gain 15 - 25 lbs; obese/BMI >30->gain  11 - 20 lbs Problem list reviewed and updated. AFP3 discussed: plan after 14 weeks. Role of ultrasound in pregnancy discussed; fetal survey: plan b/t 18-20 weeks. Amniocentesis discussed: not indicated. Follow up in 4 weeks. Discuss uterine fibroid NV. Encouraged breastfeeding.  80% of 50 min visit spent on counseling and coordination of care.   Raina Sole Wilson Singer CNM

## 2013-08-19 NOTE — Addendum Note (Signed)
Addended byWilson Singer, Reola Buckles H on: 08/19/2013 05:33 PM   Modules accepted: Orders

## 2013-08-20 LAB — CULTURE, OB URINE
Colony Count: NO GROWTH
Organism ID, Bacteria: NO GROWTH

## 2013-08-20 LAB — OBSTETRIC PANEL
Antibody Screen: NEGATIVE
Basophils Absolute: 0 10*3/uL (ref 0.0–0.1)
Hepatitis B Surface Ag: NEGATIVE
Lymphocytes Relative: 18 % (ref 12–46)
Neutro Abs: 5.4 10*3/uL (ref 1.7–7.7)
Platelets: 339 10*3/uL (ref 150–400)
RDW: 13.4 % (ref 11.5–15.5)
Rh Type: POSITIVE
WBC: 7.4 10*3/uL (ref 4.0–10.5)

## 2013-08-20 LAB — PAP IG, CT-NG, RFX HPV ASCU

## 2013-08-21 LAB — HEMOGLOBINOPATHY EVALUATION
Hemoglobin Other: 0 %
Hgb A2 Quant: 2.7 % (ref 2.2–3.2)
Hgb A: 97 % (ref 96.8–97.8)

## 2013-08-22 ENCOUNTER — Encounter: Payer: Self-pay | Admitting: Advanced Practice Midwife

## 2013-08-22 DIAGNOSIS — E559 Vitamin D deficiency, unspecified: Secondary | ICD-10-CM | POA: Insufficient documentation

## 2013-08-27 ENCOUNTER — Encounter: Payer: Self-pay | Admitting: Advanced Practice Midwife

## 2013-08-29 ENCOUNTER — Other Ambulatory Visit: Payer: Self-pay | Admitting: Advanced Practice Midwife

## 2013-08-29 DIAGNOSIS — E559 Vitamin D deficiency, unspecified: Secondary | ICD-10-CM

## 2013-08-29 MED ORDER — VITAMIN D 400 UNITS PO TABS: 800.0000 [IU] | ORAL_TABLET | Freq: Every day | ORAL | Status: DC

## 2013-09-09 ENCOUNTER — Encounter: Payer: Self-pay | Admitting: Obstetrics

## 2013-09-09 ENCOUNTER — Ambulatory Visit (INDEPENDENT_AMBULATORY_CARE_PROVIDER_SITE_OTHER): Payer: Medicaid Other | Admitting: Obstetrics

## 2013-09-09 ENCOUNTER — Encounter: Payer: Self-pay | Admitting: *Deleted

## 2013-09-09 VITALS — BP 119/79 | Temp 99.2°F | Wt 174.0 lb

## 2013-09-09 DIAGNOSIS — Z3400 Encounter for supervision of normal first pregnancy, unspecified trimester: Secondary | ICD-10-CM

## 2013-09-09 DIAGNOSIS — Z3402 Encounter for supervision of normal first pregnancy, second trimester: Secondary | ICD-10-CM

## 2013-09-09 DIAGNOSIS — O99891 Other specified diseases and conditions complicating pregnancy: Secondary | ICD-10-CM

## 2013-09-09 DIAGNOSIS — K5900 Constipation, unspecified: Secondary | ICD-10-CM

## 2013-09-09 NOTE — Progress Notes (Signed)
P 60 Patient reports she has bleeding from her rectum- constipation. Patient reports her last BM was Sunday. Patient reports she is having pain L side and under rib. Patient stands a lot at work.

## 2013-09-16 ENCOUNTER — Ambulatory Visit (INDEPENDENT_AMBULATORY_CARE_PROVIDER_SITE_OTHER): Payer: Medicaid Other | Admitting: Advanced Practice Midwife

## 2013-09-16 VITALS — BP 113/71 | Temp 97.6°F | Wt 170.0 lb

## 2013-09-16 DIAGNOSIS — Z3402 Encounter for supervision of normal first pregnancy, second trimester: Secondary | ICD-10-CM

## 2013-09-16 DIAGNOSIS — Z3400 Encounter for supervision of normal first pregnancy, unspecified trimester: Secondary | ICD-10-CM

## 2013-09-16 LAB — POCT URINALYSIS DIPSTICK
Bilirubin, UA: NEGATIVE
Ketones, UA: NEGATIVE
Spec Grav, UA: 1.02

## 2013-09-16 NOTE — Progress Notes (Signed)
Routine Obstetrical Visit  Subjective:    Patricia [name] is being seen today for her routine obstetrical visit. She is at [redacted]w[redacted]d gestation.   Patient reports no complaints. Constipation resolved   Objective:     BP 113/71  Temp(Src) 97.6 F (36.4 C)  Wt 170 lb (77.111 kg)  BMI 27.45 kg/m2  LMP 05/24/2013 Physical Exam  Exam FHR 150  2 Below SP    Assessment:    Pregnancy: G1P0 Patient Active Problem List   Diagnosis Date Noted  . Constipation in pregnancy in second trimester 09/09/2013  . Unspecified vitamin D deficiency 08/22/2013  . Supervision of normal first pregnancy 08/19/2013  . History of abnormal Pap smear 08/19/2013  . Uterine fibroid 08/19/2013       Plan:     Prenatal vitamins. Problem list reviewed and updated.  Reviewed methods to help prevent constipation. Follow up in 4 weeks. 20 week [place] NV  80% of 20 min visit spent on counseling and coordination of care.     Ayleen Mckinstry 09/16/2013

## 2013-09-18 LAB — AFP, QUAD SCREEN
Down Syndrome Scr Risk Est: 1:4550 {titer}
HCG, Total: 27142 m[IU]/mL
INH: 124.6 pg/mL
Interpretation-AFP: NEGATIVE
MoM for hCG: 1.17
Open Spina bifida: NEGATIVE
Osb Risk: 1:39900 {titer}
Tri 18 Scr Risk Est: NEGATIVE
uE3 Mom: 0.77
uE3 Value: 0.4 ng/mL

## 2013-09-23 ENCOUNTER — Encounter: Payer: Self-pay | Admitting: Advanced Practice Midwife

## 2013-09-25 ENCOUNTER — Other Ambulatory Visit: Payer: Self-pay

## 2013-09-25 ENCOUNTER — Ambulatory Visit (INDEPENDENT_AMBULATORY_CARE_PROVIDER_SITE_OTHER): Payer: Medicaid Other | Admitting: Obstetrics

## 2013-09-25 ENCOUNTER — Encounter: Payer: Self-pay | Admitting: Obstetrics

## 2013-09-25 VITALS — BP 130/79 | Temp 96.8°F | Wt 172.0 lb

## 2013-09-25 DIAGNOSIS — Z3402 Encounter for supervision of normal first pregnancy, second trimester: Secondary | ICD-10-CM

## 2013-09-25 DIAGNOSIS — Z3400 Encounter for supervision of normal first pregnancy, unspecified trimester: Secondary | ICD-10-CM

## 2013-09-25 DIAGNOSIS — IMO0002 Reserved for concepts with insufficient information to code with codable children: Secondary | ICD-10-CM | POA: Insufficient documentation

## 2013-09-25 DIAGNOSIS — O228X9 Other venous complications in pregnancy, unspecified trimester: Secondary | ICD-10-CM

## 2013-09-25 DIAGNOSIS — O2242 Hemorrhoids in pregnancy, second trimester: Secondary | ICD-10-CM

## 2013-09-25 LAB — POCT URINALYSIS DIPSTICK
Blood, UA: NEGATIVE
Glucose, UA: NEGATIVE
Protein, UA: NEGATIVE
Spec Grav, UA: <=1.005
Urobilinogen, UA: NEGATIVE
pH, UA: 7

## 2013-09-25 MED ORDER — HYDROCORTISONE ACETATE 25 MG RE SUPP
25.0000 mg | Freq: Two times a day (BID) | RECTAL | Status: DC
Start: 2013-09-25 — End: 2013-12-02

## 2013-09-25 NOTE — Progress Notes (Signed)
Pulse-59 Pt states she is still suffering with constipation. Pt states she is currently taking 2 Colace daily. Pt states she has also noticed hemorrhoids that are not being helped  By Tucks or Preparation H. Pt states she has noticed some bleeding after having a bowel movement.

## 2013-10-03 ENCOUNTER — Encounter: Payer: Self-pay | Admitting: Advanced Practice Midwife

## 2013-10-14 ENCOUNTER — Other Ambulatory Visit: Payer: Self-pay | Admitting: Advanced Practice Midwife

## 2013-10-14 ENCOUNTER — Encounter: Payer: Self-pay | Admitting: Obstetrics & Gynecology

## 2013-10-14 ENCOUNTER — Ambulatory Visit (INDEPENDENT_AMBULATORY_CARE_PROVIDER_SITE_OTHER): Payer: Medicaid Other | Admitting: Advanced Practice Midwife

## 2013-10-14 ENCOUNTER — Ambulatory Visit (INDEPENDENT_AMBULATORY_CARE_PROVIDER_SITE_OTHER): Payer: Medicaid Other

## 2013-10-14 ENCOUNTER — Encounter: Payer: Self-pay | Admitting: Advanced Practice Midwife

## 2013-10-14 VITALS — BP 114/68 | Temp 97.6°F | Wt 173.0 lb

## 2013-10-14 DIAGNOSIS — Z1389 Encounter for screening for other disorder: Secondary | ICD-10-CM

## 2013-10-14 DIAGNOSIS — O365990 Maternal care for other known or suspected poor fetal growth, unspecified trimester, not applicable or unspecified: Secondary | ICD-10-CM

## 2013-10-14 DIAGNOSIS — Z3402 Encounter for supervision of normal first pregnancy, second trimester: Secondary | ICD-10-CM

## 2013-10-14 DIAGNOSIS — Z3400 Encounter for supervision of normal first pregnancy, unspecified trimester: Secondary | ICD-10-CM

## 2013-10-14 LAB — POCT URINALYSIS DIPSTICK
Blood, UA: NEGATIVE
Ketones, UA: NEGATIVE
Protein, UA: NEGATIVE
Spec Grav, UA: <=1.005

## 2013-10-14 LAB — US OB DETAIL + 14 WK

## 2013-10-14 NOTE — Progress Notes (Signed)
Subjective: Cathye Kreiter is a 32 y.o. at 20 weeks by LMP, early ultrasound  Patient denies vaginal leaking of fluid or bleeding, denies contractions.  Reports positive fetal movment.  Denies concerns today.  Objective: Filed Vitals:   150 FHR @U  Fundal Height Fetal Position unknown  Assessment: Patient Active Problem List   Diagnosis Date Noted  . Hemorrhoids complicating pregnancy or puerperium 09/25/2013  . Constipation in pregnancy in second trimester 09/09/2013  . Unspecified vitamin D deficiency 08/22/2013  . Supervision of normal first pregnancy 08/19/2013  . History of abnormal Pap smear 08/19/2013  . Uterine fibroid 08/19/2013    Plan: Patient to return to clinic in 4 weeks Reviewed warning signs in pregnancy. Patient to call with concerns PRN. Reviewed triage location. Reviewed methods to help w/ HA Reviewed methods to help w/ constipation and pain from hemorrhoids. Cont to monitor.   15 min spent with patient greater than 80% spent in counseling and coordination of care.   Deaire Mcwhirter Wilson Singer CNM

## 2013-10-14 NOTE — Progress Notes (Signed)
Pulse: 64

## 2013-10-20 ENCOUNTER — Encounter: Payer: Self-pay | Admitting: Advanced Practice Midwife

## 2013-11-11 ENCOUNTER — Ambulatory Visit (INDEPENDENT_AMBULATORY_CARE_PROVIDER_SITE_OTHER): Payer: Medicaid Other | Admitting: Advanced Practice Midwife

## 2013-11-11 ENCOUNTER — Other Ambulatory Visit: Payer: Medicaid Other

## 2013-11-11 VITALS — BP 122/76 | Temp 96.9°F | Wt 174.0 lb

## 2013-11-11 DIAGNOSIS — Z3400 Encounter for supervision of normal first pregnancy, unspecified trimester: Secondary | ICD-10-CM

## 2013-11-11 DIAGNOSIS — Z3402 Encounter for supervision of normal first pregnancy, second trimester: Secondary | ICD-10-CM

## 2013-11-11 LAB — CBC
MCH: 31.5 pg (ref 26.0–34.0)
MCV: 93.7 fL (ref 78.0–100.0)
Platelets: 282 10*3/uL (ref 150–400)
RDW: 13.6 % (ref 11.5–15.5)
WBC: 7.6 10*3/uL (ref 4.0–10.5)

## 2013-11-11 LAB — POCT URINALYSIS DIPSTICK
Blood, UA: NEGATIVE
Protein, UA: NEGATIVE
Spec Grav, UA: <=1.005
Urobilinogen, UA: NEGATIVE

## 2013-11-11 NOTE — Progress Notes (Signed)
Patient doing well. No concerns.  Ronae Noell Wilson Singer CNM

## 2013-11-11 NOTE — Progress Notes (Signed)
Pulse: 66

## 2013-11-20 NOTE — L&D Delivery Note (Signed)
Delivery Note At 12:55 AM a viable female was delivered via Vaginal, Spontaneous Delivery (Presentation: Right Occiput Anterior).  APGAR: 8, 9; weight .   Placenta status: Intact, Spontaneous.  Cord: 3 vessels with the following complications: None.  Cord pH: none  Anesthesia: Epidural  Episiotomy: None Lacerations: None Suture Repair: none Est. Blood Loss (mL): 350  Mom to postpartum.  Baby to Couplet care / Skin to Skin.  HARPER,CHARLES A 02/18/2014, 1:42 AM

## 2013-12-02 ENCOUNTER — Inpatient Hospital Stay (HOSPITAL_COMMUNITY)
Admission: AD | Admit: 2013-12-02 | Discharge: 2013-12-02 | Disposition: A | Discharge status: Home / Self Care | Payer: Medicaid Other | Source: Ambulatory Visit | Attending: Obstetrics | Admitting: Obstetrics

## 2013-12-02 ENCOUNTER — Encounter (HOSPITAL_COMMUNITY): Payer: Self-pay | Admitting: *Deleted

## 2013-12-02 DIAGNOSIS — K649 Unspecified hemorrhoids: Secondary | ICD-10-CM | POA: Insufficient documentation

## 2013-12-02 DIAGNOSIS — O2240 Hemorrhoids in pregnancy, unspecified trimester: Secondary | ICD-10-CM

## 2013-12-02 DIAGNOSIS — O228X9 Other venous complications in pregnancy, unspecified trimester: Secondary | ICD-10-CM | POA: Insufficient documentation

## 2013-12-02 DIAGNOSIS — O2390 Unspecified genitourinary tract infection in pregnancy, unspecified trimester: Secondary | ICD-10-CM | POA: Insufficient documentation

## 2013-12-02 DIAGNOSIS — K625 Hemorrhage of anus and rectum: Secondary | ICD-10-CM | POA: Insufficient documentation

## 2013-12-02 DIAGNOSIS — B3731 Acute candidiasis of vulva and vagina: Secondary | ICD-10-CM | POA: Insufficient documentation

## 2013-12-02 DIAGNOSIS — R109 Unspecified abdominal pain: Secondary | ICD-10-CM | POA: Insufficient documentation

## 2013-12-02 DIAGNOSIS — D649 Anemia, unspecified: Secondary | ICD-10-CM | POA: Insufficient documentation

## 2013-12-02 DIAGNOSIS — N949 Unspecified condition associated with female genital organs and menstrual cycle: Secondary | ICD-10-CM

## 2013-12-02 DIAGNOSIS — O99019 Anemia complicating pregnancy, unspecified trimester: Secondary | ICD-10-CM | POA: Insufficient documentation

## 2013-12-02 DIAGNOSIS — M549 Dorsalgia, unspecified: Secondary | ICD-10-CM | POA: Insufficient documentation

## 2013-12-02 DIAGNOSIS — B373 Candidiasis of vulva and vagina: Secondary | ICD-10-CM

## 2013-12-02 HISTORY — DX: Other specified health status: Z78.9

## 2013-12-02 LAB — URINE MICROSCOPIC-ADD ON

## 2013-12-02 LAB — URINALYSIS, ROUTINE W REFLEX MICROSCOPIC
Bilirubin Urine: NEGATIVE
GLUCOSE, UA: NEGATIVE mg/dL
Hgb urine dipstick: NEGATIVE
KETONES UR: NEGATIVE mg/dL
NITRITE: NEGATIVE
PH: 7 (ref 5.0–8.0)
Protein, ur: NEGATIVE mg/dL
SPECIFIC GRAVITY, URINE: 1.015 (ref 1.005–1.030)
Urobilinogen, UA: 0.2 mg/dL (ref 0.0–1.0)

## 2013-12-02 LAB — CBC
HCT: 29.3 % — ABNORMAL LOW (ref 36.0–46.0)
HEMOGLOBIN: 10.5 g/dL — AB (ref 12.0–15.0)
MCH: 31.5 pg (ref 26.0–34.0)
MCHC: 35.8 g/dL (ref 30.0–36.0)
MCV: 88 fL (ref 78.0–100.0)
PLATELETS: 222 10*3/uL (ref 150–400)
RBC: 3.33 MIL/uL — AB (ref 3.87–5.11)
RDW: 12.6 % (ref 11.5–15.5)
WBC: 10.1 10*3/uL (ref 4.0–10.5)

## 2013-12-02 LAB — WET PREP, GENITAL
Trich, Wet Prep: NONE SEEN
WBC WET PREP: NONE SEEN

## 2013-12-02 MED ORDER — FLUCONAZOLE 150 MG PO TABS
150.0000 mg | ORAL_TABLET | Freq: Once | ORAL | Status: AC
  Administered 2013-12-02: 150 mg via ORAL
  Filled 2013-12-02: qty 1

## 2013-12-02 NOTE — MAU Note (Signed)
Pt states she doesn't have a problem with bleeding but has in the past had a problem with constipaton and hemeroids and bleeding could have come from her recturm

## 2013-12-02 NOTE — Discharge Instructions (Signed)
Patricia [name], Adult A Patricia [name] (also called yeast, fungus and Monilia [name]) is an overgrowth of yeast that can occur anywhere on the body. A yeast [name] commonly occurs in warm, moist body areas. Usually, [name] remains localized but can spread to become a systemic [name]. A yeast [name] may be a sign of a more severe disease such as diabetes, leukemia, or AIDS. A yeast [name] can occur in both men and women. In women, Patricia vaginitis is a vaginal [name]. It is one of the most common causes of vaginitis. Men usually do not have symptoms or know they have an [name] until other problems develop. Men may find out they have a yeast [name] because their sex partner has a yeast [name]. Uncircumcised men are more likely to get a yeast [name] than circumcised men. This is because the uncircumcised glans is not exposed to air and does not remain as dry as that of a circumcised glans. Older adults may develop yeast infections around dentures. CAUSES  Women  Antibiotics.  Steroid medication taken for a long time.  Being overweight (obese).  Diabetes.  Poor immune condition.  Certain serious medical conditions.  Immune suppressive medications for organ transplant patients.  Chemotherapy.  Pregnancy.  Menstration.  Stress and fatigue.  Intravenous drug use.  Oral contraceptives.  Wearing tight-fitting clothes in the crotch area.  Catching it from a sex partner who has a yeast [name].  Spermicide.  Intravenous, urinary, or other catheters. Men  Catching it from a sex partner who has a yeast [name].  Having oral or anal sex with a person who has [name].  Spermicide.  Diabetes.  Antibiotics.  Poor immune system.  Medications that suppress the immune system.  Intravenous drug use.  Intravenous, urinary, or other catheters. SYMPTOMS  Women  Thick, white vaginal discharge.  Vaginal itching.  Redness and  swelling in and around the vagina.  Irritation of the lips of the vagina and perineum.  Blisters on the vaginal lips and perineum.  Painful sexual intercourse.  Low blood sugar (hypoglycemia).  Painful urination.  Bladder infections.  Intestinal problems such as constipation, indigestion, bad breath, bloating, increase in gas, diarrhea, or loose stools. Men  Men may develop intestinal problems such as constipation, indigestion, bad breath, bloating, increase in gas, diarrhea, or loose stools.  Dry, cracked skin on the penis with itching or discomfort.  Jock itch.  Dry, flaky skin.  Athlete's foot.  Hypoglycemia. DIAGNOSIS  Women  A history and an exam are performed.  The discharge may be examined under a microscope.  A culture may be taken of the discharge. Men  A history and an exam are performed.  Any discharge from the penis or areas of cracked skin will be looked at under the microscope and cultured.  Stool samples may be cultured. TREATMENT  Women  Vaginal antifungal suppositories and creams.  Medicated creams to decrease irritation and itching on the outside of the vagina.  Warm compresses to the perineal area to decrease swelling and discomfort.  Oral antifungal medications.  Medicated vaginal suppositories or cream for repeated or recurrent infections.  Wash and dry the irritation areas before applying the cream.  Eating yogurt with lactobacillus may help with prevention and treatment.  Sometimes painting the vagina with gentian violet solution may help if creams and suppositories do not work. Men  Antifungal creams and oral antifungal medications.  Sometimes treatment must continue for 30 days after the symptoms go away to prevent recurrence. HOME CARE   INSTRUCTIONS  Women  Use cotton underwear and avoid tight-fitting clothing.  Avoid colored, scented toilet paper and deodorant tampons or pads.  Do not douche.  Keep your diabetes  under control.  Finish all the prescribed medications.  Keep your skin clean and dry.  Consume milk or yogurt with lactobacillus active culture regularly. If you get frequent yeast infections and think that is what [name] is, there are over-the-counter medications that you can get. If [name] does not show healing in 3 days, talk to your caregiver.  Tell your sex partner you have a yeast [name]. Your partner may need treatment also, especially if your [name] does not clear up or recurs. Men  Keep your skin clean and dry.  Keep your diabetes under control.  Finish all prescribed medications.  Tell your sex partner that you have a yeast [name] so they can be treated if necessary. SEEK MEDICAL CARE IF:   Your symptoms do not clear up or worsen in one week after treatment.  You have an oral temperature above 102 F (38.9 C).  You have trouble swallowing or eating for a prolonged time.  You develop blisters on and around your vagina.  You develop vaginal bleeding and it is not your menstrual period.  You develop abdominal pain.  You develop intestinal problems as mentioned above.  You get weak or lightheaded.  You have painful or increased urination.  You have pain during sexual intercourse. MAKE SURE YOU:   Understand these instructions.  Will watch your condition.  Will get help right away if you are not doing well or get worse. Document Released: 12/14/2004 Document Revised: 01/29/2012 Document Reviewed: 03/28/2010 Exit[employer] Patient Information 2014 [employer].  

## 2013-12-02 NOTE — MAU Provider Note (Signed)
History     CSN: [id number]  Arrival date and time: 12/02/13 6433   First Provider Initiated Contact with Patient 12/02/13 1924      Chief Complaint  Patient presents with  . Vaginal Bleeding   HPI  Ms. Patricia [name] is 33 y.o. female G1P0 at [redacted]w[redacted]d who presents with left sided abdominal/ back pain that started 1 week ago. Pain is worse with walking. Pt is also concerned about rectal bleeding possibly coming from a hemorrhoid. She had a BM today and thought she saw blood separate from her stool in the toilet. Pt did not call the office and notify them that she was coming to MAU.    OB History   Grav Para Term Preterm Abortions TAB SAB Ect Mult Living   1               Past Medical History  Diagnosis Date  . Medical history non-contributory     Past Surgical History  Procedure Laterality Date  . No past surgeries      History reviewed. No pertinent family history.  History  Substance Use Topics  . Smoking status: Never Smoker   . Smokeless tobacco: Not on file  . Alcohol Use: No     Comment: occ    Allergies:  Allergies  Allergen Reactions  . Penicillins Anaphylaxis    Prescriptions prior to admission  Medication Sig Dispense Refill  . acetaminophen (TYLENOL) 500 MG tablet Take 1,000 mg by mouth every 6 (six) hours as needed for pain.      . vitamin D, CHOLECALCIFEROL, 400 UNITS tablet Take 2 tablets (800 Units total) by mouth daily.  90 tablet  4   Results for orders placed during the hospital encounter of 12/02/13 (from the past 48 hour(s))  URINALYSIS, ROUTINE W REFLEX MICROSCOPIC     Status: Abnormal   Collection Time    12/02/13  5:30 PM      Result Value Range   Color, Urine YELLOW  YELLOW   APPearance CLEAR  CLEAR   Specific Gravity, Urine 1.015  1.005 - 1.030   pH 7.0  5.0 - 8.0   Glucose, UA NEGATIVE  NEGATIVE mg/dL   Hgb urine dipstick NEGATIVE  NEGATIVE   Bilirubin Urine NEGATIVE  NEGATIVE   Ketones, ur NEGATIVE  NEGATIVE mg/dL   Protein, ur NEGATIVE  NEGATIVE mg/dL   Urobilinogen, UA 0.2  0.0 - 1.0 mg/dL   Nitrite NEGATIVE  NEGATIVE   Leukocytes, UA TRACE (*) NEGATIVE  URINE MICROSCOPIC-ADD ON     Status: None   Collection Time    12/02/13  5:30 PM      Result Value Range   Squamous Epithelial / LPF RARE  RARE   WBC, UA 0-2  <3 WBC/hpf   RBC / HPF 0-2  <3 RBC/hpf   Bacteria, UA RARE  RARE  CBC     Status: Abnormal   Collection Time    12/02/13  6:26 PM      Result Value Range   WBC 10.1  4.0 - 10.5 K/uL   RBC 3.33 (*) 3.87 - 5.11 MIL/uL   Hemoglobin 10.5 (*) 12.0 - 15.0 g/dL   HCT 29.3 (*) 36.0 - 46.0 %   MCV 88.0  78.0 - 100.0 fL   MCH 31.5  26.0 - 34.0 pg   MCHC 35.8  30.0 - 36.0 g/dL   RDW 12.6  11.5 - 15.5 %   Platelets 222  150 - 400 K/uL  WET PREP, GENITAL     Status: Abnormal   Collection Time    12/02/13  7:35 PM      Result Value Range   Yeast Wet Prep HPF POC FEW (*) NONE SEEN   Trich, Wet Prep NONE SEEN  NONE SEEN   Clue Cells Wet Prep HPF POC FEW (*) NONE SEEN   WBC, Wet Prep HPF POC NONE SEEN  NONE SEEN   Comment: MODERATE BACTERIA SEEN    Review of Systems  Constitutional: Negative for fever and chills.  Gastrointestinal: Positive for constipation and blood in stool. Negative for nausea, vomiting, abdominal pain and diarrhea.  Genitourinary: Negative for dysuria, urgency, frequency and hematuria.       No vaginal discharge. No vaginal bleeding. No dysuria.    Physical Exam   Blood pressure 132/73, pulse 60, temperature 98.4 F (36.9 C), temperature source Oral, resp. rate 18, last menstrual period 05/24/2013.  Physical Exam  Constitutional: She is oriented to person, place, and time. She appears well-developed and well-nourished. No distress.  HENT:  Head: Normocephalic.  Eyes: Pupils are equal, round, and reactive to light.  Neck: Neck supple.  Respiratory: Effort normal.  GI: Soft. Bowel sounds are normal. She exhibits no distension and no mass. There is tenderness in  the left upper quadrant and left lower quadrant. There is no rebound and no guarding.  Left upper and lower quadrant tenderness   Genitourinary: No vaginal discharge found.  Speculum exam: Vagina - Small amount of creamy discharge, no odor Cervix - No contact bleeding Bimanual exam: deferred  GC/Chlam, wet prep done Chaperone present for exam.   Neurological: She is alert and oriented to person, place, and time.  Skin: Skin is warm. She is not diaphoretic.    Fetal Tracing: Baseline: 135 bpm  Variability: moderate  Accelerations: 15x15 Decelerations: None Toco: None   MAU Course  Procedures None  MDM NST  Pt decline rectal exam   Assessment and Plan   A:  1. Round ligament pain   2. Hemorrhoids in pregnancy   3. Vaginal yeast infection   4. Anemia    P:  Discharge home Change positions slowly Stool softeners as needed, as directed on the bottle Diflucan 150 mg PO given in MAU Start taking your prenatal vitamins with iron  Follow up in the office   Darrelyn Hillock Nysia Dell, NP  12/02/2013, 8:37 PM

## 2013-12-03 LAB — GC/CHLAMYDIA PROBE AMP
CT Probe RNA: NEGATIVE
GC Probe RNA: NEGATIVE

## 2013-12-03 LAB — OB RESULTS CONSOLE GC/CHLAMYDIA
Chlamydia: NEGATIVE
Gonorrhea: NEGATIVE

## 2013-12-09 ENCOUNTER — Other Ambulatory Visit: Payer: Self-pay | Admitting: Advanced Practice Midwife

## 2013-12-09 ENCOUNTER — Other Ambulatory Visit: Payer: Medicaid Other

## 2013-12-09 ENCOUNTER — Ambulatory Visit (INDEPENDENT_AMBULATORY_CARE_PROVIDER_SITE_OTHER): Payer: Medicaid Other | Admitting: Advanced Practice Midwife

## 2013-12-09 VITALS — BP 125/78 | Temp 97.0°F | Wt 175.0 lb

## 2013-12-09 DIAGNOSIS — K5900 Constipation, unspecified: Secondary | ICD-10-CM

## 2013-12-09 DIAGNOSIS — K219 Gastro-esophageal reflux disease without esophagitis: Secondary | ICD-10-CM

## 2013-12-09 DIAGNOSIS — Z3400 Encounter for supervision of normal first pregnancy, unspecified trimester: Secondary | ICD-10-CM

## 2013-12-09 LAB — POCT URINALYSIS DIPSTICK
Bilirubin, UA: NEGATIVE
Blood, UA: NEGATIVE
Glucose, UA: NEGATIVE
Ketones, UA: NEGATIVE
Leukocytes, UA: NEGATIVE
Nitrite, UA: NEGATIVE
PROTEIN UA: NEGATIVE
Urobilinogen, UA: NEGATIVE
pH, UA: 6

## 2013-12-09 MED ORDER — NEXA PLUS 29-1.25-350 MG PO CAPS: 1.0000 | ORAL_CAPSULE | Freq: Every day | ORAL | Status: DC

## 2013-12-09 MED ORDER — RANITIDINE HCL 150 MG PO TABS: 150.0000 mg | ORAL_TABLET | Freq: Two times a day (BID) | ORAL | Status: DC

## 2013-12-09 NOTE — Progress Notes (Signed)
Routine Obstetrical Visit  Subjective:    Patricia [name] is being seen today for her routine obstetrical visit. She is at [redacted]w[redacted]d gestation.   Patient reports c/o vomitting and reflux. Reports side pain she went to MAU for resolved. Continues to not take PNV because of constipation and hemorrhoids.  Uncertain if she wants to [facility], went to [facility] class. Plans going to [facility] for a Lamaze class.   Objective:     BP 125/78  Temp(Src) 97 F (36.1 C)  Wt 175 lb (79.379 kg)  LMP 05/24/2013 Physical Exam  Exam FH 28, FHR 150   Assessment:    Pregnancy: G1P0 Patient Active Problem List   Diagnosis Date Noted  . Hemorrhoids complicating pregnancy or puerperium 09/25/2013  . Constipation in pregnancy in second trimester 09/09/2013  . Unspecified vitamin D deficiency 08/22/2013  . Supervision of normal first pregnancy 08/19/2013  . History of abnormal Pap smear 08/19/2013  . Uterine fibroid 08/19/2013       Plan:     Prenatal vitamins. Problem list reviewed and updated.  Zantac for heartburn. Follow up in 2 weeks. Continue to discuss BCM NV PNV w/ stool softener Rx submitted. 80% of 20 min visit spent on counseling and coordination of care.     Nickey Kloepfer 12/09/2013

## 2013-12-09 NOTE — Addendum Note (Signed)
Addended by: Valli Glance F on: 12/09/2013 01:53 PM   Modules accepted: Orders

## 2013-12-09 NOTE — Addendum Note (Signed)
Addended by: Valli Glance F on: 12/09/2013 02:13 PM   Modules accepted: Orders

## 2013-12-09 NOTE — Progress Notes (Signed)
Pulse: 57 Patient went to [facility] hospital for lower left abdominal pain. Patient stated she was told she had a yeast infection and that she was anemic. Patient stated she was told by hospital she needed to be on a prenatal vitamin. Patient states she is still having a lot of morning sickness.

## 2013-12-11 LAB — GLUCOSE TOLERANCE, 1 HOUR (50G) W/O FASTING: GLUCOSE 1 HOUR GTT: 116 mg/dL (ref 70–140)

## 2013-12-13 ENCOUNTER — Encounter: Payer: Self-pay | Admitting: Advanced Practice Midwife

## 2013-12-23 ENCOUNTER — Ambulatory Visit (INDEPENDENT_AMBULATORY_CARE_PROVIDER_SITE_OTHER): Payer: Medicaid Other | Admitting: Advanced Practice Midwife

## 2013-12-23 VITALS — BP 131/80 | Temp 98.4°F | Wt 176.0 lb

## 2013-12-23 DIAGNOSIS — Z3400 Encounter for supervision of normal first pregnancy, unspecified trimester: Secondary | ICD-10-CM

## 2013-12-23 LAB — POCT URINALYSIS DIPSTICK
Bilirubin, UA: NEGATIVE
Blood, UA: NEGATIVE
Glucose, UA: NEGATIVE
KETONES UA: NEGATIVE
Leukocytes, UA: NEGATIVE
Nitrite, UA: NEGATIVE
PH UA: 8
Protein, UA: NEGATIVE
Urobilinogen, UA: NEGATIVE

## 2013-12-23 NOTE — Progress Notes (Signed)
Subjective: Patricia [name] is a 33 y.o. at 30 weeks by LMP  Patient denies vaginal leaking of fluid or bleeding, denies contractions.  Reports positive fetal movment.  Denies concerns today. Cannot afford to take PNV, over $200 for 30 days.  Objective: Filed Vitals:   12/23/13 1403  BP: 131/80  Temp: 98.4 F (36.9 C)   140 FHR 30 Fundal Height Fetal Position cephalic  Assessment: Patient Active Problem List   Diagnosis Date Noted  . Hemorrhoids complicating pregnancy or puerperium 09/25/2013  . Constipation in pregnancy in second trimester 09/09/2013  . Unspecified vitamin D deficiency 08/22/2013  . Supervision of normal first pregnancy 08/19/2013  . History of abnormal Pap smear 08/19/2013  . Uterine fibroid 08/19/2013    Plan: Patient to return to clinic in 2 weeks Patient taking Lamaze classes OCP for [facility] PP Discussed plan for GBS @ 36 weeks Reviewed warning signs in pregnancy. Patient to call with concerns PRN. Reviewed triage location.  20 min spent with patient greater than 80% spent in counseling and coordination of care.

## 2013-12-23 NOTE — Progress Notes (Signed)
Pulse:61 Patient denies any concerns.

## 2013-12-26 ENCOUNTER — Encounter: Payer: Self-pay | Admitting: Advanced Practice Midwife

## 2014-01-02 ENCOUNTER — Encounter: Payer: Self-pay | Admitting: Advanced Practice Midwife

## 2014-01-06 ENCOUNTER — Encounter: Payer: Medicaid Other | Admitting: Advanced Practice Midwife

## 2014-01-08 ENCOUNTER — Ambulatory Visit (INDEPENDENT_AMBULATORY_CARE_PROVIDER_SITE_OTHER): Payer: Medicaid Other | Admitting: Obstetrics

## 2014-01-08 ENCOUNTER — Encounter: Payer: Self-pay | Admitting: Obstetrics

## 2014-01-08 VITALS — BP 136/79 | Temp 98.1°F | Wt 179.0 lb

## 2014-01-08 DIAGNOSIS — Z3400 Encounter for supervision of normal first pregnancy, unspecified trimester: Secondary | ICD-10-CM

## 2014-01-08 DIAGNOSIS — K219 Gastro-esophageal reflux disease without esophagitis: Secondary | ICD-10-CM

## 2014-01-08 LAB — POCT URINALYSIS DIPSTICK
BILIRUBIN UA: NEGATIVE
GLUCOSE UA: NEGATIVE
KETONES UA: NEGATIVE
Leukocytes, UA: NEGATIVE
Nitrite, UA: NEGATIVE
Protein, UA: NEGATIVE
RBC UA: NEGATIVE
Urobilinogen, UA: NEGATIVE
pH, UA: 7

## 2014-01-08 NOTE — Progress Notes (Signed)
Pulse: 55 Patient states she is having pelvic pressure. Patient denies any concerns.

## 2014-01-09 MED ORDER — OMEPRAZOLE 20 MG PO CPDR: 20.0000 mg | DELAYED_RELEASE_CAPSULE | Freq: Every day | ORAL | Status: DC

## 2014-01-09 NOTE — Addendum Note (Signed)
Addended by: Shelly Bombard on: 01/09/2014 07:35 AM   Modules accepted: Orders

## 2014-01-22 ENCOUNTER — Ambulatory Visit (INDEPENDENT_AMBULATORY_CARE_PROVIDER_SITE_OTHER): Payer: Medicaid Other | Admitting: Obstetrics

## 2014-01-22 ENCOUNTER — Encounter: Payer: Self-pay | Admitting: Obstetrics

## 2014-01-22 VITALS — BP 130/86 | Temp 98.7°F | Wt 175.0 lb

## 2014-01-22 DIAGNOSIS — Z3400 Encounter for supervision of normal first pregnancy, unspecified trimester: Secondary | ICD-10-CM

## 2014-01-22 LAB — POCT URINALYSIS DIPSTICK
Bilirubin, UA: NEGATIVE
Blood, UA: NEGATIVE
GLUCOSE UA: NEGATIVE
KETONES UA: NEGATIVE
LEUKOCYTES UA: NEGATIVE
Nitrite, UA: NEGATIVE
Protein, UA: NEGATIVE
Spec Grav, UA: <=1.005
Urobilinogen, UA: NEGATIVE
pH, UA: 7

## 2014-01-22 NOTE — Progress Notes (Signed)
Pulse 52 Pt states that she is having some pelvic pressure.

## 2014-01-26 LAB — CULTURE, STREPTOCOCCUS GRP B W/SUSCEPT

## 2014-01-26 LAB — OB RESULTS CONSOLE GBS: GBS: POSITIVE

## 2014-01-29 ENCOUNTER — Encounter: Payer: Self-pay | Admitting: *Deleted

## 2014-01-29 ENCOUNTER — Ambulatory Visit (INDEPENDENT_AMBULATORY_CARE_PROVIDER_SITE_OTHER): Payer: Medicaid Other | Admitting: Obstetrics

## 2014-01-29 ENCOUNTER — Encounter: Payer: Self-pay | Admitting: Obstetrics

## 2014-01-29 VITALS — BP 139/85 | Temp 98.9°F | Wt 180.0 lb

## 2014-01-29 DIAGNOSIS — Z3400 Encounter for supervision of normal first pregnancy, unspecified trimester: Secondary | ICD-10-CM

## 2014-01-29 LAB — POCT URINALYSIS DIPSTICK
Bilirubin, UA: NEGATIVE
Glucose, UA: NEGATIVE
Ketones, UA: NEGATIVE
NITRITE UA: NEGATIVE
PROTEIN UA: NEGATIVE
RBC UA: NEGATIVE
Spec Grav, UA: <=1.005
UROBILINOGEN UA: NEGATIVE
pH, UA: 7

## 2014-01-29 NOTE — Progress Notes (Signed)
Pulse: 68 Patient denies any concerns.

## 2014-02-03 ENCOUNTER — Encounter: Payer: Self-pay | Admitting: Obstetrics

## 2014-02-03 ENCOUNTER — Ambulatory Visit (INDEPENDENT_AMBULATORY_CARE_PROVIDER_SITE_OTHER): Payer: Medicaid Other | Admitting: Obstetrics

## 2014-02-03 VITALS — BP 150/81 | Temp 98.5°F | Wt 181.0 lb

## 2014-02-03 DIAGNOSIS — Z3400 Encounter for supervision of normal first pregnancy, unspecified trimester: Secondary | ICD-10-CM

## 2014-02-03 LAB — POCT URINALYSIS DIPSTICK
Bilirubin, UA: NEGATIVE
Blood, UA: NEGATIVE
Glucose, UA: NEGATIVE
Ketones, UA: NEGATIVE
Nitrite, UA: NEGATIVE
PH UA: 6.5
PROTEIN UA: NEGATIVE
UROBILINOGEN UA: NEGATIVE

## 2014-02-03 NOTE — Progress Notes (Signed)
Pulse: 58 Patient states she is having lower abdominal and pelvic pressure. Patient denies any concerns.

## 2014-02-10 ENCOUNTER — Ambulatory Visit (INDEPENDENT_AMBULATORY_CARE_PROVIDER_SITE_OTHER): Payer: Medicaid Other | Admitting: Obstetrics

## 2014-02-10 ENCOUNTER — Encounter: Payer: Self-pay | Admitting: *Deleted

## 2014-02-10 VITALS — BP 138/77 | Temp 97.8°F | Wt 183.0 lb

## 2014-02-10 DIAGNOSIS — Z3400 Encounter for supervision of normal first pregnancy, unspecified trimester: Secondary | ICD-10-CM

## 2014-02-10 LAB — POCT URINALYSIS DIPSTICK
BILIRUBIN UA: NEGATIVE
Glucose, UA: NEGATIVE
KETONES UA: NEGATIVE
Nitrite, UA: NEGATIVE
PROTEIN UA: NEGATIVE
RBC UA: NEGATIVE
Spec Grav, UA: 1.01
Urobilinogen, UA: NEGATIVE
pH, UA: 7

## 2014-02-10 NOTE — Progress Notes (Signed)
Pulse- 52 Pt states she is having lower abdominal pressure.

## 2014-02-11 ENCOUNTER — Encounter: Payer: Self-pay | Admitting: Obstetrics

## 2014-02-17 ENCOUNTER — Encounter: Payer: Medicaid Other | Admitting: Obstetrics

## 2014-02-17 ENCOUNTER — Inpatient Hospital Stay (HOSPITAL_COMMUNITY): Payer: Medicaid Other | Admitting: Anesthesiology

## 2014-02-17 ENCOUNTER — Inpatient Hospital Stay (HOSPITAL_COMMUNITY)
Admission: AD | Admit: 2014-02-17 | Discharge: 2014-02-20 | DRG: 775 | Disposition: A | Discharge status: Home / Self Care | Payer: Medicaid Other | Source: Ambulatory Visit | Attending: Obstetrics | Admitting: Obstetrics

## 2014-02-17 ENCOUNTER — Encounter (HOSPITAL_COMMUNITY): Payer: Self-pay | Admitting: *Deleted

## 2014-02-17 ENCOUNTER — Encounter (HOSPITAL_COMMUNITY): Payer: Medicaid Other | Admitting: Anesthesiology

## 2014-02-17 ENCOUNTER — Inpatient Hospital Stay (HOSPITAL_COMMUNITY): Payer: Medicaid Other

## 2014-02-17 DIAGNOSIS — O9989 Other specified diseases and conditions complicating pregnancy, childbirth and the puerperium: Secondary | ICD-10-CM

## 2014-02-17 DIAGNOSIS — Z22330 Carrier of Group B streptococcus: Secondary | ICD-10-CM

## 2014-02-17 DIAGNOSIS — K219 Gastro-esophageal reflux disease without esophagitis: Secondary | ICD-10-CM | POA: Diagnosis present

## 2014-02-17 DIAGNOSIS — E559 Vitamin D deficiency, unspecified: Secondary | ICD-10-CM

## 2014-02-17 DIAGNOSIS — D649 Anemia, unspecified: Secondary | ICD-10-CM | POA: Diagnosis present

## 2014-02-17 DIAGNOSIS — Z3400 Encounter for supervision of normal first pregnancy, unspecified trimester: Secondary | ICD-10-CM

## 2014-02-17 DIAGNOSIS — O4290 Premature rupture of membranes, unspecified as to length of time between rupture and onset of labor, unspecified weeks of gestation: Principal | ICD-10-CM | POA: Diagnosis present

## 2014-02-17 DIAGNOSIS — Z3490 Encounter for supervision of normal pregnancy, unspecified, unspecified trimester: Secondary | ICD-10-CM

## 2014-02-17 DIAGNOSIS — O9902 Anemia complicating childbirth: Secondary | ICD-10-CM | POA: Diagnosis present

## 2014-02-17 DIAGNOSIS — D259 Leiomyoma of uterus, unspecified: Secondary | ICD-10-CM

## 2014-02-17 DIAGNOSIS — O99892 Other specified diseases and conditions complicating childbirth: Secondary | ICD-10-CM | POA: Diagnosis present

## 2014-02-17 DIAGNOSIS — Z880 Allergy status to penicillin: Secondary | ICD-10-CM

## 2014-02-17 LAB — CBC
HCT: 32.5 % — ABNORMAL LOW (ref 36.0–46.0)
Hemoglobin: 10.9 g/dL — ABNORMAL LOW (ref 12.0–15.0)
MCH: 29.8 pg (ref 26.0–34.0)
MCHC: 33.5 g/dL (ref 30.0–36.0)
MCV: 88.8 fL (ref 78.0–100.0)
PLATELETS: 241 10*3/uL (ref 150–400)
RBC: 3.66 MIL/uL — ABNORMAL LOW (ref 3.87–5.11)
RDW: 13.1 % (ref 11.5–15.5)
WBC: 5.6 10*3/uL (ref 4.0–10.5)

## 2014-02-17 LAB — TYPE AND SCREEN
ABO/RH(D): O POS
Antibody Screen: NEGATIVE

## 2014-02-17 LAB — RPR: RPR: NONREACTIVE

## 2014-02-17 LAB — ABO/RH: ABO/RH(D): O POS

## 2014-02-17 MED ORDER — TERBUTALINE SULFATE 1 MG/ML IJ SOLN: 0.2500 mg | Freq: Once | INTRAMUSCULAR | Status: AC | PRN

## 2014-02-17 MED ORDER — FENTANYL 2.5 MCG/ML BUPIVACAINE 1/10 % EPIDURAL INFUSION (WH - ANES)
14.0000 mL/h | INTRAMUSCULAR | Status: DC | PRN
  Administered 2014-02-17: 14 mL/h via EPIDURAL
  Filled 2014-02-17: qty 125

## 2014-02-17 MED ORDER — DIPHENHYDRAMINE HCL 50 MG/ML IJ SOLN: 12.5000 mg | INTRAMUSCULAR | Status: DC | PRN

## 2014-02-17 MED ORDER — PHENYLEPHRINE 40 MCG/ML (10ML) SYRINGE FOR IV PUSH (FOR BLOOD PRESSURE SUPPORT)
80.0000 ug | PREFILLED_SYRINGE | INTRAVENOUS | Status: DC | PRN
  Filled 2014-02-17: qty 2

## 2014-02-17 MED ORDER — LACTATED RINGERS IV SOLN: 500.0000 mL | INTRAVENOUS | Status: DC | PRN

## 2014-02-17 MED ORDER — LIDOCAINE HCL (PF) 1 % IJ SOLN
30.0000 mL | INTRAMUSCULAR | Status: DC | PRN
  Filled 2014-02-17: qty 30

## 2014-02-17 MED ORDER — CITRIC ACID-SODIUM CITRATE 334-500 MG/5ML PO SOLN: 30.0000 mL | ORAL | Status: DC | PRN

## 2014-02-17 MED ORDER — OXYTOCIN BOLUS FROM INFUSION
500.0000 mL | INTRAVENOUS | Status: DC
  Administered 2014-02-18: 500 mL via INTRAVENOUS

## 2014-02-17 MED ORDER — CLINDAMYCIN PHOSPHATE 900 MG/50ML IV SOLN
900.0000 mg | Freq: Three times a day (TID) | INTRAVENOUS | Status: DC
  Administered 2014-02-17 (×2): 900 mg via INTRAVENOUS
  Filled 2014-02-17 (×4): qty 50

## 2014-02-17 MED ORDER — EPHEDRINE 5 MG/ML INJ
10.0000 mg | INTRAVENOUS | Status: DC | PRN
  Filled 2014-02-17: qty 2
  Filled 2014-02-17: qty 4

## 2014-02-17 MED ORDER — LIDOCAINE HCL (PF) 1 % IJ SOLN
INTRAMUSCULAR | Status: DC | PRN
  Administered 2014-02-17 (×4): 4 mL

## 2014-02-17 MED ORDER — IBUPROFEN 600 MG PO TABS: 600.0000 mg | ORAL_TABLET | Freq: Four times a day (QID) | ORAL | Status: DC | PRN

## 2014-02-17 MED ORDER — FENTANYL CITRATE 0.05 MG/ML IJ SOLN
100.0000 ug | INTRAMUSCULAR | Status: DC | PRN
  Administered 2014-02-17 (×3): 100 ug via INTRAVENOUS
  Filled 2014-02-17 (×3): qty 2

## 2014-02-17 MED ORDER — OXYTOCIN 40 UNITS IN LACTATED RINGERS INFUSION - SIMPLE MED
1.0000 m[IU]/min | INTRAVENOUS | Status: DC
  Administered 2014-02-17: 2 m[IU]/min via INTRAVENOUS
  Filled 2014-02-17: qty 1000

## 2014-02-17 MED ORDER — ONDANSETRON HCL 4 MG/2ML IJ SOLN
4.0000 mg | Freq: Four times a day (QID) | INTRAMUSCULAR | Status: DC | PRN
  Administered 2014-02-17: 4 mg via INTRAVENOUS
  Filled 2014-02-17: qty 2

## 2014-02-17 MED ORDER — FENTANYL CITRATE 0.05 MG/ML IJ SOLN
50.0000 ug | Freq: Once | INTRAMUSCULAR | Status: AC
  Administered 2014-02-17: 50 ug via INTRAVENOUS
  Filled 2014-02-17: qty 2

## 2014-02-17 MED ORDER — PHENYLEPHRINE 40 MCG/ML (10ML) SYRINGE FOR IV PUSH (FOR BLOOD PRESSURE SUPPORT)
80.0000 ug | PREFILLED_SYRINGE | INTRAVENOUS | Status: DC | PRN
  Filled 2014-02-17: qty 2
  Filled 2014-02-17: qty 10

## 2014-02-17 MED ORDER — OXYCODONE-ACETAMINOPHEN 5-325 MG PO TABS: 1.0000 | ORAL_TABLET | ORAL | Status: DC | PRN

## 2014-02-17 MED ORDER — LACTATED RINGERS IV SOLN
INTRAVENOUS | Status: DC
  Administered 2014-02-17: 23:00:00 via INTRAVENOUS
  Administered 2014-02-17 (×2): 125 mL/h via INTRAVENOUS

## 2014-02-17 MED ORDER — EPHEDRINE 5 MG/ML INJ
10.0000 mg | INTRAVENOUS | Status: DC | PRN
  Filled 2014-02-17: qty 2

## 2014-02-17 MED ORDER — LACTATED RINGERS IV SOLN
500.0000 mL | Freq: Once | INTRAVENOUS | Status: AC
  Administered 2014-02-17: 500 mL via INTRAVENOUS

## 2014-02-17 MED ORDER — ACETAMINOPHEN 325 MG PO TABS: 650.0000 mg | ORAL_TABLET | ORAL | Status: DC | PRN

## 2014-02-17 MED ORDER — OXYTOCIN 40 UNITS IN LACTATED RINGERS INFUSION - SIMPLE MED: 62.5000 mL/h | INTRAVENOUS | Status: DC

## 2014-02-17 NOTE — Anesthesia Preprocedure Evaluation (Signed)
Anesthesia Evaluation  Patient identified by MRN, date of birth, ID band Patient awake    Reviewed: Allergy & Precautions, H&P , NPO status , Patient's Chart, lab work & pertinent test results, reviewed documented beta blocker date and time   History of Anesthesia Complications Negative for: history of anesthetic complications  Airway Mallampati: III TM Distance: >3 FB Neck ROM: full    Dental  (+) Teeth Intact   Pulmonary neg pulmonary ROS,  breath sounds clear to auscultation        Cardiovascular negative cardio ROS  Rhythm:regular Rate:Normal     Neuro/Psych negative neurological ROS  negative psych ROS   GI/Hepatic Neg liver ROS, GERD-  Medicated,  Endo/Other  negative endocrine ROS  Renal/GU negative Renal ROS     Musculoskeletal   Abdominal   Peds  Hematology  (+) anemia ,   Anesthesia Other Findings   Reproductive/Obstetrics (+) Pregnancy                           Anesthesia Physical Anesthesia Plan  ASA: II  Anesthesia Plan: Epidural   Post-op Pain Management:    Induction:   Airway Management Planned:   Additional Equipment:   Intra-op Plan:   Post-operative Plan:   Informed Consent: I have reviewed the patients History and Physical, chart, labs and discussed the procedure including the risks, benefits and alternatives for the proposed anesthesia with the patient or authorized representative who has indicated his/her understanding and acceptance.     Plan Discussed with:   Anesthesia Plan Comments:         Anesthesia Quick Evaluation

## 2014-02-17 NOTE — Progress Notes (Signed)
Patricia [name] is a 33 y.o. G1P0000 at [redacted]w[redacted]d by LMP admitted for rupture of membranes  Subjective: Patient uncomfortable w/ contractions, requesting pain relief.  Objective: BP 144/84  Pulse 45  Temp(Src) 97.6 F (36.4 C) (Oral)  Resp 20  Ht 5\' 6"  (1.676 m)  Wt 183 lb (83.008 kg)  BMI 29.55 kg/m2  LMP 05/24/2013      FHT:  FHR: 130 bpm, variability: moderate,  accelerations:  Present,  decelerations:  Absent UC:   none SVE:   Dilation: Fingertip Effacement (%): 60 Station: -3 Exam by:: Garnette Czech, CNM  Labs: Lab Results  Component Value Date   WBC 5.6 02/17/2014   HGB 10.9* 02/17/2014   HCT 32.5* 02/17/2014   MCV 88.8 02/17/2014   PLT 241 02/17/2014    Assessment / Plan: Augmentation of labor, progressing well Fentanyl orders at this time. Consider CLE in the future.  Low dose pitocin @ 6 mU, maintain at this time.  Labor: Progressing normally Preeclampsia:  no signs or symptoms of toxicity Fetal Wellbeing:  Category I Pain Control:  Fentanyl I/D:  n/a Anticipated MOD:  NSVD  Patricia [name] 02/17/2014, 5:13 PM

## 2014-02-17 NOTE — H&P (Signed)
Patricia [name] is a 33 y.o. female presenting for ROM w/ out labor. Maternal Medical History:  Reason for admission: Rupture of membranes.   Fetal activity: Perceived fetal activity is normal.   Last perceived fetal movement was within the past hour.    Prenatal complications: No bleeding.     OB History   Grav Para Term Preterm Abortions TAB SAB Ect Mult Living   1 0 0 0 0 0 0 0 0 0      Past Medical History  Diagnosis Date  . Medical history non-contributory    Past Surgical History  Procedure Laterality Date  . No past surgeries     Family History: family history is not on file. Social History:  reports that she has never smoked. She does not have any smokeless tobacco history on file. She reports that she does not drink alcohol or use illicit drugs.   Prenatal Transfer Tool  Maternal Diabetes: No Genetic Screening: Normal Maternal Ultrasounds/Referrals: Normal Fetal Ultrasounds or other Referrals:  None Maternal Substance Abuse:  No Significant Maternal Medications:  None Significant Maternal Lab Results:  None Other Comments:  None  Review of Systems  Constitutional: Negative.   HENT: Negative.   Eyes: Negative.   Respiratory: Negative.   Cardiovascular: Negative.   Gastrointestinal: Negative.   Genitourinary: Negative.   Musculoskeletal: Negative.   Skin: Negative.   Neurological: Negative.   Endo/Heme/Allergies: Negative.   Psychiatric/Behavioral: Negative.     Dilation: Fingertip Effacement (%): 60 Station: -3 Exam by:: Javarion Douty Wrenn, CNM Blood pressure 144/84, pulse 45, temperature 97.6 F (36.4 C), temperature source Oral, resp. rate 20, height 5\' 6"  (1.676 m), weight 183 lb (83.008 kg), last menstrual period 05/24/2013. Maternal Exam:  Abdomen: Patient reports no abdominal tenderness. Fundal height is 39.   Estimated fetal weight is 3100.   Fetal presentation: vertex  Introitus: Normal vulva. Normal vagina.  Ferning test: positive.   Amniotic fluid character: clear.  Pelvis: adequate for delivery.   Cervix: Cervix evaluated by digital exam.     Physical Exam  Constitutional: She is oriented to person, place, and time. She appears well-developed and well-nourished.  HENT:  Head: Normocephalic.  Neck: Normal range of motion. Neck supple.  Cardiovascular: Normal rate and regular rhythm.   Respiratory: Effort normal and breath sounds normal.  GI: Soft. Bowel sounds are normal.  Genitourinary: Vagina normal and uterus normal.  Musculoskeletal: Normal range of motion.  Neurological: She is alert and oriented to person, place, and time. She has normal reflexes.  Skin: Skin is warm and dry.  Psychiatric: She has a normal mood and affect. Her behavior is normal. Judgment and thought content normal.    Prenatal labs: ABO, Rh: --/--/O POS, O POS (03/31 1010) Antibody: NEG (03/31 1010) Rubella: 2.46 (09/30 1147) RPR: NON REAC (12/23 1319)  HBsAg: NEGATIVE (09/30 1147)  HIV: NON REACTIVE (12/23 1319)  GBS: Positive (03/09 0000)   Assessment/Plan: Admit to L&D Low Dose Pitocin to Augment labor Limit vaginal exams to reduce risk of infection. +GBS/w/ PCN allergy, Clindamycin IV 900 mgQ8 MD Jodi Mourning aware and agrees w/ plan.   Aryan Bello 02/17/2014, 5:10 PM

## 2014-02-17 NOTE — Anesthesia Procedure Notes (Signed)
Epidural Patient location during procedure: OB Start time: 02/17/2014 10:51 PM  Staffing Performed by: anesthesiologist   Preanesthetic Checklist Completed: patient identified, site marked, surgical consent, pre-op evaluation, timeout performed, IV checked, risks and benefits discussed and monitors and equipment checked  Epidural Patient position: sitting Prep: site prepped and draped and DuraPrep Patient monitoring: continuous pulse ox and blood pressure Approach: midline Injection technique: LOR air  Needle:  Needle type: Tuohy  Needle gauge: 17 G Needle length: 9 cm and 9 Needle insertion depth: 5 cm cm Catheter type: closed end flexible Catheter size: 19 Gauge Catheter at skin depth: 10 cm Test dose: negative  Assessment Events: blood not aspirated, injection not painful, no injection resistance, negative IV test and no paresthesia  Additional Notes Discussed risk of headache, infection, bleeding, nerve injury and failed or incomplete block.  Patient voices understanding and wishes to proceed.  Epidural placed easily on first attempt.  No paresthesia.  Patient tolerated procedure well with no apparent complications.  Charlton Haws MD Reason for block:procedure for pain

## 2014-02-17 NOTE — MAU Note (Signed)
Started leaking about 45minutes ago.  Clear fluid, still coming- sitting on a towel. Denies bleeding.  Unsure if contracting.  Denies any complications.

## 2014-02-18 ENCOUNTER — Encounter (HOSPITAL_COMMUNITY): Payer: Self-pay

## 2014-02-18 LAB — CBC
HEMATOCRIT: 30.5 % — AB (ref 36.0–46.0)
HEMOGLOBIN: 10.4 g/dL — AB (ref 12.0–15.0)
MCH: 30.1 pg (ref 26.0–34.0)
MCHC: 34.1 g/dL (ref 30.0–36.0)
MCV: 88.4 fL (ref 78.0–100.0)
Platelets: 207 10*3/uL (ref 150–400)
RBC: 3.45 MIL/uL — ABNORMAL LOW (ref 3.87–5.11)
RDW: 13 % (ref 11.5–15.5)
WBC: 17.2 10*3/uL — ABNORMAL HIGH (ref 4.0–10.5)

## 2014-02-18 MED ORDER — DIBUCAINE 1 % RE OINT: 1.0000 "application " | TOPICAL_OINTMENT | RECTAL | Status: DC | PRN

## 2014-02-18 MED ORDER — ONDANSETRON HCL 4 MG PO TABS: 4.0000 mg | ORAL_TABLET | ORAL | Status: DC | PRN

## 2014-02-18 MED ORDER — LANOLIN HYDROUS EX OINT: TOPICAL_OINTMENT | CUTANEOUS | Status: DC | PRN

## 2014-02-18 MED ORDER — OXYTOCIN 40 UNITS IN LACTATED RINGERS INFUSION - SIMPLE MED: 62.5000 mL/h | INTRAVENOUS | Status: DC | PRN

## 2014-02-18 MED ORDER — ZOLPIDEM TARTRATE 5 MG PO TABS: 5.0000 mg | ORAL_TABLET | Freq: Every evening | ORAL | Status: DC | PRN

## 2014-02-18 MED ORDER — ONDANSETRON HCL 4 MG/2ML IJ SOLN: 4.0000 mg | INTRAMUSCULAR | Status: DC | PRN

## 2014-02-18 MED ORDER — SENNOSIDES-DOCUSATE SODIUM 8.6-50 MG PO TABS
2.0000 | ORAL_TABLET | ORAL | Status: DC
  Administered 2014-02-18 – 2014-02-19 (×2): 2 via ORAL
  Filled 2014-02-18 (×2): qty 2

## 2014-02-18 MED ORDER — TETANUS-DIPHTH-ACELL PERTUSSIS 5-2.5-18.5 LF-MCG/0.5 IM SUSP
0.5000 mL | Freq: Once | INTRAMUSCULAR | Status: AC
  Administered 2014-02-19: 0.5 mL via INTRAMUSCULAR
  Filled 2014-02-18: qty 0.5

## 2014-02-18 MED ORDER — DIPHENHYDRAMINE HCL 25 MG PO CAPS: 25.0000 mg | ORAL_CAPSULE | Freq: Four times a day (QID) | ORAL | Status: DC | PRN

## 2014-02-18 MED ORDER — BENZOCAINE-MENTHOL 20-0.5 % EX AERO: 1.0000 "application " | INHALATION_SPRAY | CUTANEOUS | Status: DC | PRN

## 2014-02-18 MED ORDER — METHYLERGONOVINE MALEATE 0.2 MG/ML IJ SOLN: 0.2000 mg | INTRAMUSCULAR | Status: DC | PRN

## 2014-02-18 MED ORDER — WITCH HAZEL-GLYCERIN EX PADS: 1.0000 "application " | MEDICATED_PAD | CUTANEOUS | Status: DC | PRN

## 2014-02-18 MED ORDER — MEDROXYPROGESTERONE ACETATE 150 MG/ML IM SUSP: 150.0000 mg | INTRAMUSCULAR | Status: DC | PRN

## 2014-02-18 MED ORDER — SIMETHICONE 80 MG PO CHEW: 80.0000 mg | CHEWABLE_TABLET | ORAL | Status: DC | PRN

## 2014-02-18 MED ORDER — OXYCODONE-ACETAMINOPHEN 5-325 MG PO TABS: 1.0000 | ORAL_TABLET | ORAL | Status: DC | PRN

## 2014-02-18 MED ORDER — PRENATAL MULTIVITAMIN CH
1.0000 | ORAL_TABLET | Freq: Every day | ORAL | Status: DC
  Administered 2014-02-20: 1 via ORAL
  Filled 2014-02-18 (×3): qty 1

## 2014-02-18 MED ORDER — IBUPROFEN 600 MG PO TABS
600.0000 mg | ORAL_TABLET | Freq: Four times a day (QID) | ORAL | Status: DC
  Administered 2014-02-18 – 2014-02-20 (×10): 600 mg via ORAL
  Filled 2014-02-18 (×10): qty 1

## 2014-02-18 MED ORDER — METHYLERGONOVINE MALEATE 0.2 MG PO TABS: 0.2000 mg | ORAL_TABLET | ORAL | Status: DC | PRN

## 2014-02-18 NOTE — Progress Notes (Signed)
Patricia [name] is a 33 y.o. G1P0000 at [redacted]w[redacted]d by LMP admitted for rupture of membranes  Subjective:   Objective: BP 171/78  Pulse 46  Temp(Src) 97.8 F (36.6 C) (Oral)  Resp 18  Ht 5\' 6"  (1.676 m)  Wt 183 lb (83.008 kg)  BMI 29.55 kg/m2  SpO2 98%  LMP 05/24/2013      FHT:  115 bpm UC:   regular, every 2-4 minutes SVE:   Dilation: 10 Effacement (%): 100 Station: +2 Exam by:: e. poore, rn  Labs: Lab Results  Component Value Date   WBC 5.6 02/17/2014   HGB 10.9* 02/17/2014   HCT 32.5* 02/17/2014   MCV 88.8 02/17/2014   PLT 241 02/17/2014    Assessment / Plan: Augmentation of labor, progressing well  Labor: Progressing normally Preeclampsia:  n/a Fetal Wellbeing:  Category I Pain Control:  Epidural I/D:  n/a Anticipated MOD:  NSVD  Anant Agard A 02/18/2014, 12:37 AM

## 2014-02-18 NOTE — Progress Notes (Signed)
Ur chart review completed.  

## 2014-02-18 NOTE — Anesthesia Postprocedure Evaluation (Signed)
  Anesthesia Post-op Note  Patient: Theatre manager  Procedure(s) Performed: * No procedures listed *  Patient Location: [facility]  Anesthesia Type:Epidural  Level of Consciousness: awake, alert , oriented and patient cooperative  Airway and Oxygen Therapy: Patient Spontanous Breathing  Post-op Pain: none  Post-op Assessment: Patient's Cardiovascular Status Stable, Respiratory Function Stable, Patent Airway, No signs of Nausea or vomiting, Adequate PO intake, Pain level controlled, No headache, No backache, No residual numbness and No residual motor weakness  Post-op Vital Signs: Reviewed and stable  Complications: No apparent anesthesia complications

## 2014-02-18 NOTE — Progress Notes (Signed)
Post Partum Day 0 Subjective: no complaints  Objective: Blood pressure 120/73, pulse 50, temperature 98.4 F (36.9 C), temperature source Oral, resp. rate 17, height 5\' 6"  (1.676 m), weight 183 lb (83.008 kg), last menstrual period 05/24/2013, SpO2 97.00%, unknown if currently breastfeeding.  Physical Exam:  General: alert and no distress Lochia: appropriate Uterine Fundus: firm Incision: healing well DVT Evaluation: No evidence of DVT seen on physical exam. Negative Homan's sign.   Recent Labs  02/17/14 1010 02/18/14 0658  HGB 10.9* 10.4*  HCT 32.5* 30.5*    Assessment/Plan: Plan for discharge tomorrow   LOS: 1 day   HARPER,CHARLES A 02/18/2014, 7:56 AM

## 2014-02-18 NOTE — Lactation Note (Signed)
This note was copied from the chart of Patricia Jaquel Chervenak. Lactation Consultation Note  Patient Name: Patricia [name] EFEOF'H Date: 02/18/2014 Reason for consult: Initial assessment;Other (Comment) (charting for exclusion) RN, Exie Parody informs [place] that this mom is planning to formula/bottle-feed only   Maternal Data Formula Feeding for Exclusion: Yes Reason for exclusion: Mother's choice to formula and breast feed on admission (now states she will only formula feed) Infant to breast within first hour of birth: No Breastfeeding delayed due to:: Other (comment) (mom decided not ot breastfeed) Does the patient have breastfeeding experience prior to this delivery?: No  Feeding Feeding Type: Formula Nipple Type: Regular  LATCH Score/Interventions                      Lactation Tools Discussed/Used     Consult Status Consult Status: Complete    Bernita Buffy 02/18/2014, 3:27 AM

## 2014-02-19 NOTE — Progress Notes (Signed)
Patient ID: Patricia [name], female   DOB: 09-24-1981, 33 y.o.   MRN: [id number] Post Partum Day 1 Subjective: no complaints  Objective: Blood pressure 144/76, pulse 50, temperature 97.6 F (36.4 C), temperature source Oral, resp. rate 18, height 5\' 6"  (1.676 m), weight 83.008 kg (183 lb), last menstrual period 05/24/2013, SpO2 100.00%, unknown if currently breastfeeding.  Physical Exam:  General: alert and no distress Lochia: appropriate Uterine Fundus: firm Incision: healing well DVT Evaluation: No evidence of DVT seen on physical exam. Negative Homan's sign.   Recent Labs  02/17/14 1010 02/18/14 0658  HGB 10.9* 10.4*  HCT 32.5* 30.5*    Assessment/Plan: Normal postpartum exam  OK for discharge if infant ready   LOS: 2 days   JACKSON-MOORE,Safiyah Cisney A 02/19/2014, 8:15 AM

## 2014-02-20 MED ORDER — IBUPROFEN 600 MG PO TABS: 600.0000 mg | ORAL_TABLET | Freq: Four times a day (QID) | ORAL | Status: DC | PRN

## 2014-02-20 MED ORDER — OXYCODONE-ACETAMINOPHEN 5-325 MG PO TABS: 1.0000 | ORAL_TABLET | ORAL | Status: DC | PRN

## 2014-02-20 NOTE — Progress Notes (Signed)
Post Partum Day 2 Subjective: no complaints  Objective: Blood pressure 136/75, pulse 44, temperature 97.4 F (36.3 C), temperature source Oral, resp. rate 18, height 5\' 6"  (1.676 m), weight 183 lb (83.008 kg), last menstrual period 05/24/2013, SpO2 100.00%, unknown if currently breastfeeding.  Physical Exam:  General: alert and no distress Lochia: appropriate Uterine Fundus: firm Incision: none DVT Evaluation: No evidence of DVT seen on physical exam.   Recent Labs  02/18/14 0658  HGB 10.4*  HCT 30.5*    Assessment/Plan: Discharge home   LOS: 3 days   HARPER,CHARLES A 02/20/2014, 10:42 AM

## 2014-02-24 ENCOUNTER — Ambulatory Visit (INDEPENDENT_AMBULATORY_CARE_PROVIDER_SITE_OTHER): Payer: Medicaid Other | Admitting: Obstetrics

## 2014-02-24 ENCOUNTER — Encounter: Payer: Medicaid Other | Admitting: Obstetrics

## 2014-02-24 DIAGNOSIS — Z3400 Encounter for supervision of normal first pregnancy, unspecified trimester: Secondary | ICD-10-CM

## 2014-02-24 DIAGNOSIS — Z3009 Encounter for other general counseling and advice on contraception: Secondary | ICD-10-CM

## 2014-02-24 DIAGNOSIS — R52 Pain, unspecified: Secondary | ICD-10-CM

## 2014-02-24 MED ORDER — OXYCODONE HCL 10 MG PO TABS: 10.0000 mg | ORAL_TABLET | Freq: Four times a day (QID) | ORAL | Status: DC | PRN

## 2014-02-24 MED ORDER — LEVONORGESTREL-ETHINYL ESTRAD 0.15-30 MG-MCG PO TABS: 1.0000 | ORAL_TABLET | Freq: Every day | ORAL | Status: DC

## 2014-02-24 NOTE — Progress Notes (Signed)
Subjective:     Patricia [name] is a 33 y.o. female who presents for a postpartum visit. She is 1 week postpartum following a spontaneous vaginal delivery. I have fully reviewed the prenatal and intrapartum course. The delivery was at 19 gestational weeks. Outcome: spontaneous vaginal delivery. Anesthesia: epidural. Postpartum course has been going well. Baby's course has been going well. Baby is feeding by bottle - Gerber gentle. Bleeding moderate lochia. Bowel function is normal. Bladder function is normal. Patient is not sexually active. Contraception method is none.  Pt is interested in birth control pills. Postpartum depression screening: negative. Pt states that she was in need of pain medication Rx.  Pt states that she did not receive a print copy of Rx from hospital at d/c.  The following portions of the patient's history were reviewed and updated as appropriate: allergies, current medications, past family history, past medical history, past social history, past surgical history and problem list.  Review of Systems A comprehensive review of systems was negative.   Objective:    BP 152/82  Pulse 48  Temp(Src) 98.4 F (36.9 C)  Wt 163 lb (73.936 kg)  LMP 05/24/2013  General:  alert and no distress   Breasts:  inspection negative, no nipple discharge or bleeding, no masses or nodularity palpable  Lungs: clear to auscultation bilaterally  Heart:  regular rate and rhythm, S1, S2 normal, no murmur, click, rub or gallop  Abdomen: normal findings: soft, non-tender  Uterus firm, NT.                        Assessment:     Normal postpartum exam. Pap smear not done at today's visit.   Needs pain meds.  Plan:    1. Contraception: Contraceptive options discussed. 2. Percocet Rx 3. Follow up in: 6 weeks or as needed.

## 2014-03-09 ENCOUNTER — Encounter: Payer: Self-pay | Admitting: Obstetrics

## 2014-03-09 NOTE — Discharge Summary (Signed)
Obstetric Discharge Summary Reason for Admission: rupture of membranes Prenatal Procedures: ultrasound Intrapartum Procedures: spontaneous vaginal delivery Postpartum Procedures: none Complications-Operative and Postpartum: none Hemoglobin  Date Value Ref Range Status  02/18/2014 10.4* 12.0 - 15.0 g/dL Final     HCT  Date Value Ref Range Status  02/18/2014 30.5* 36.0 - 46.0 % Final    Physical Exam:  General: alert and no distress Lochia: appropriate Uterine Fundus: firm Incision: none DVT Evaluation: No evidence of DVT seen on physical exam.  Discharge Diagnoses: Term Pregnancy-delivered  Discharge Information: Date: 03/09/2014 Activity: pelvic rest Diet: routine Medications: PNV, Ibuprofen, Colace and Percocet Condition: stable Instructions: refer to practice specific booklet Discharge to: home   Newborn Data: Live born female  Birth Weight: 6 lb 4.7 oz (2855 g) APGAR: 8, 9  Home with mother.  Shelly Bombard 03/09/2014, 10:11 AM

## 2014-03-23 ENCOUNTER — Ambulatory Visit: Payer: Medicaid Other | Admitting: Obstetrics

## 2014-03-30 ENCOUNTER — Encounter: Payer: Self-pay | Admitting: Obstetrics

## 2014-03-30 ENCOUNTER — Ambulatory Visit (INDEPENDENT_AMBULATORY_CARE_PROVIDER_SITE_OTHER): Payer: Medicaid Other | Admitting: Obstetrics

## 2014-03-30 DIAGNOSIS — O878 Other venous complications in the puerperium: Secondary | ICD-10-CM

## 2014-03-30 DIAGNOSIS — K649 Unspecified hemorrhoids: Secondary | ICD-10-CM

## 2014-03-30 DIAGNOSIS — O872 Hemorrhoids in the puerperium: Secondary | ICD-10-CM

## 2014-03-30 MED ORDER — HYDROCORTISONE ACETATE 25 MG RE SUPP: 25.0000 mg | Freq: Two times a day (BID) | RECTAL | Status: DC

## 2014-03-30 NOTE — Progress Notes (Signed)
Subjective:     Patricia [name] is a 33 y.o. female who presents for a postpartum visit. She is 6 weeks postpartum following a spontaneous vaginal delivery. I have fully reviewed the prenatal and intrapartum course. The delivery was at 42 gestational weeks. Outcome: spontaneous vaginal delivery. Anesthesia: epidural. Postpartum course has been normal except for hemorrhoids. Baby's course has been normal. Baby is feeding by bottle [name]. Bleeding no bleeding. Bowel function is normal. Bladder function is normal. Patient is not sexually active. Contraception method is OCP (estrogen/progesterone). Postpartum depression screening: negative.  The following portions of the patient's history were reviewed and updated as appropriate: allergies, current medications, past family history, past medical history, past social history, past surgical history and problem list.  Review of Systems Gastrointestinal: positive for Hemorrhoids   Objective:    BP 121/72  Pulse 50  Temp(Src) 97.5 F (36.4 C)  Ht 5\' 6"  (1.676 m)  Wt 155 lb (70.308 kg)  BMI 25.03 kg/m2  Breastfeeding? No  General:  alert and no distress   Breasts:  inspection negative, no nipple discharge or bleeding, no masses or nodularity palpable  Lungs: clear to auscultation bilaterally  Heart:  regular rate and rhythm, S1, S2 normal, no murmur, click, rub or gallop  Abdomen: normal findings: soft, non-tender   Vulva:  normal  Vagina: normal vagina  Cervix:  no lesions  Corpus: normal size, contour, position, consistency, mobility, non-tender  Adnexa:  no mass, fullness, tenderness  Rectal Exam: Not performed.           Assessment:     Normal postpartum exam. Pap smear not done at today's visit. .  Persistent painful hemorrhoids.  Plan:    1. Contraception: OCP (estrogen/progesterone) 2. Anusol HC supp. Rx. 3. Follow up in: several months or as needed.

## 2014-04-28 ENCOUNTER — Telehealth: Payer: Self-pay | Admitting: *Deleted

## 2014-04-28 DIAGNOSIS — Z309 Encounter for contraceptive management, unspecified: Secondary | ICD-10-CM

## 2014-04-28 MED ORDER — MEDROXYPROGESTERONE ACETATE 150 MG/ML IM SUSP: 150.0000 mg | INTRAMUSCULAR | Status: DC

## 2014-04-28 NOTE — Telephone Encounter (Signed)
Patient states she is having trouble remembering to take her pills and wants to switch to Depo provera. Patient had her cycle last week and was supposed to start her OCP on Sunday. Patient has not been sexually active and will abstain until we can give her shot this week. OK to change per Dr Jodi Mourning. Rx E scribed to pharmacy.

## 2014-04-30 ENCOUNTER — Ambulatory Visit (INDEPENDENT_AMBULATORY_CARE_PROVIDER_SITE_OTHER): Payer: Medicaid Other | Admitting: *Deleted

## 2014-04-30 VITALS — BP 139/82 | HR 55 | Temp 98.4°F | Wt 162.0 lb

## 2014-04-30 DIAGNOSIS — Z309 Encounter for contraceptive management, unspecified: Secondary | ICD-10-CM

## 2014-04-30 DIAGNOSIS — Z3202 Encounter for pregnancy test, result negative: Secondary | ICD-10-CM

## 2014-04-30 LAB — POCT URINE PREGNANCY: Preg Test, Ur: NEGATIVE

## 2014-04-30 MED ORDER — MEDROXYPROGESTERONE ACETATE 150 MG/ML IM SUSP: 150.0000 mg | INTRAMUSCULAR | Status: DC

## 2014-04-30 MED ORDER — MEDROXYPROGESTERONE ACETATE 150 MG/ML IM SUSP
150.0000 mg | Freq: Once | INTRAMUSCULAR | Status: AC
  Administered 2014-04-30: 150 mg via INTRAMUSCULAR

## 2014-04-30 NOTE — Progress Notes (Signed)
Pt in office for birth control change.  Pt is switching for OCP to depo.  Pt was advised of policy in getting depo injections. UPT today was negative. Injection given in right upper outer quadrant.  Pt tolerated well.  Pt advised to RTO on 07/22/14 for next injection.

## 2014-05-20 ENCOUNTER — Telehealth: Payer: Self-pay | Admitting: *Deleted

## 2014-05-20 NOTE — Telephone Encounter (Signed)
Patient states she switched form pills to the Depo provera. Patient has started bleeding with small clots- heavy and cramping. 7:31 Spoke with patient regarding side effects and action of Depo Porvera. Patient is willing to stick with it to see what happens. Patient advised to used NSAIDS for cramping on regular basis and to call back if she has heavy bleeding over 1 week. Patient voiced understanding.

## 2014-06-16 ENCOUNTER — Telehealth: Payer: Self-pay

## 2014-06-16 NOTE — Telephone Encounter (Signed)
patient called has internal/external hemorroids - very much in pain - checked with Opal Sidles and Dr. Jodi Mourning and they suggested [facility] - patient had been to hospital and they just gave her [place] inactive and they said would cost 226.00 for self-pay - called patient and let her know medicaid was inactive per [place] [facility] and [employer] - gave her number and cost they would charge - she stated would call them for appt.

## 2014-07-22 ENCOUNTER — Ambulatory Visit: Payer: Medicaid Other

## 2014-08-20 ENCOUNTER — Institutional Professional Consult (permissible substitution): Payer: Medicaid Other | Admitting: Obstetrics

## 2014-09-04 ENCOUNTER — Other Ambulatory Visit: Payer: Self-pay

## 2014-09-07 ENCOUNTER — Institutional Professional Consult (permissible substitution): Payer: Medicaid Other | Admitting: Obstetrics

## 2014-09-08 ENCOUNTER — Other Ambulatory Visit (INDEPENDENT_AMBULATORY_CARE_PROVIDER_SITE_OTHER): Payer: Medicaid Other | Admitting: *Deleted

## 2014-09-08 ENCOUNTER — Institutional Professional Consult (permissible substitution): Payer: Medicaid Other | Admitting: Obstetrics

## 2014-09-08 VITALS — BP 137/80 | HR 50 | Temp 98.1°F | Wt 156.0 lb

## 2014-09-08 DIAGNOSIS — Z3202 Encounter for pregnancy test, result negative: Secondary | ICD-10-CM

## 2014-09-08 DIAGNOSIS — Z30013 Encounter for initial prescription of injectable contraceptive: Secondary | ICD-10-CM

## 2014-09-08 LAB — POCT URINE PREGNANCY: Preg Test, Ur: NEGATIVE

## 2014-09-08 NOTE — Progress Notes (Signed)
Pt is in office today for UPT to restart Depo.  Pt states that she has not had a cycle since June 2015.  Pt had previously started depo in June but missed 2nd injection.  Pt states she last had intercourse 2 weeks ago.  Pt advised to return in 2 weeks for repeat UPT and if negative can restart depo.  Pt states understanding.  BP 137/80  Pulse 50  Temp(Src) 98.1 F (36.7 C)  Wt 156 lb (70.761 kg)

## 2014-09-21 ENCOUNTER — Encounter: Payer: Self-pay | Admitting: Obstetrics

## 2014-09-24 ENCOUNTER — Ambulatory Visit (INDEPENDENT_AMBULATORY_CARE_PROVIDER_SITE_OTHER): Payer: Self-pay | Admitting: Obstetrics

## 2014-09-24 VITALS — BP 119/73 | HR 60 | Temp 97.4°F | Ht 66.0 in | Wt 160.0 lb

## 2014-09-24 DIAGNOSIS — Z30013 Encounter for initial prescription of injectable contraceptive: Secondary | ICD-10-CM

## 2014-09-24 LAB — POCT URINE PREGNANCY: Preg Test, Ur: NEGATIVE

## 2014-09-24 NOTE — Progress Notes (Signed)
Patient in office today to restart Depo. Patient states she last had intercourse about 1 1/2 weeks ago without protection. Patient advised would perform pregnancy test today and she should abstain from intercourse or use condoms for the next two weeks. Patient advised to return in two weeks for another UPT and the Depo injection.   Pregnancy Test in office is negative.   BP 119/73 mmHg  Pulse 60  Temp(Src) 97.4 F (36.3 C)  Ht 5\' 6"  (1.676 m)  Wt 160 lb (72.576 kg)  BMI 25.84 kg/m2  Breastfeeding? No

## 2014-09-25 ENCOUNTER — Encounter: Payer: Self-pay | Admitting: Obstetrics

## 2014-10-08 ENCOUNTER — Other Ambulatory Visit (INDEPENDENT_AMBULATORY_CARE_PROVIDER_SITE_OTHER): Payer: Self-pay | Admitting: *Deleted

## 2014-10-08 VITALS — BP 131/85 | HR 51 | Ht 66.0 in | Wt 162.0 lb

## 2014-10-08 DIAGNOSIS — Z3042 Encounter for surveillance of injectable contraceptive: Secondary | ICD-10-CM

## 2014-10-08 LAB — POCT URINE PREGNANCY: Preg Test, Ur: NEGATIVE

## 2014-10-08 MED ORDER — MEDROXYPROGESTERONE ACETATE 150 MG/ML IM SUSP
150.0000 mg | INTRAMUSCULAR | Status: AC
  Administered 2014-10-08 – 2015-03-30 (×3): 150 mg via INTRAMUSCULAR

## 2014-10-08 NOTE — Progress Notes (Signed)
Patient is in the office today for UPT, DEPO restart. UPT preformed, results were negative. Injection given in Right Upper Outer Quadrant. Patient tolerated well. Patient notified to return on December 30, 2014 for her next DEPO Injection. Patient voiced understanding.   Results for orders placed or performed in visit on 10/08/14 (from the past 24 hour(s))  POCT urine pregnancy     Status: None   Collection Time: 10/08/14  4:19 PM  Result Value Ref Range   Preg Test, Ur Negative     Administrations This Visit    medroxyPROGESTERone (DEPO-PROVERA) injection 150 mg    Administered Action Dose Route Administered By         10/08/2014 Given 150 mg Intramuscular Ladona Ridgel, LPN

## 2015-01-05 ENCOUNTER — Ambulatory Visit (INDEPENDENT_AMBULATORY_CARE_PROVIDER_SITE_OTHER): Payer: Self-pay | Admitting: *Deleted

## 2015-01-05 VITALS — BP 125/76 | HR 50 | Temp 98.1°F | Ht 66.0 in | Wt 173.0 lb

## 2015-01-05 DIAGNOSIS — Z3042 Encounter for surveillance of injectable contraceptive: Secondary | ICD-10-CM

## 2015-01-05 NOTE — Progress Notes (Signed)
Patient in office today for a Depo Injection. Patient is on time for her injection.  Patient tolerated injection well.  Patient due for next injection Mar 30, 2015.  BP 125/76 mmHg  Pulse 50  Temp(Src) 98.1 F (36.7 C)  Ht 5\' 6"  (1.676 m)  Wt 173 lb (78.472 kg)  BMI 27.94 kg/m2  Breastfeeding? No

## 2015-03-30 ENCOUNTER — Ambulatory Visit (INDEPENDENT_AMBULATORY_CARE_PROVIDER_SITE_OTHER): Payer: Medicaid Other | Admitting: *Deleted

## 2015-03-30 VITALS — BP 116/74 | HR 54 | Temp 98.7°F | Ht 66.0 in | Wt 182.0 lb

## 2015-03-30 DIAGNOSIS — Z3049 Encounter for surveillance of other contraceptives: Secondary | ICD-10-CM | POA: Diagnosis not present

## 2015-03-30 DIAGNOSIS — Z3042 Encounter for surveillance of injectable contraceptive: Secondary | ICD-10-CM

## 2015-03-30 NOTE — Progress Notes (Signed)
Patient in office today for a Depo Injection. Patient is on time for her injection. Patient tolerated injection well.  Patient to return to office June 21, 2015.  BP 116/74 mmHg  Pulse 54  Temp(Src) 98.7 F (37.1 C)  Ht 5\' 6"  (1.676 m)  Wt 182 lb (82.555 kg)  BMI 29.39 kg/m2  Administrations This Visit    medroxyPROGESTERone (DEPO-PROVERA) injection 150 mg    Admin Date Action Dose Route Administered By         03/30/2015 Given 150 mg Intramuscular Carole Binning, LPN

## 2015-06-22 ENCOUNTER — Ambulatory Visit: Payer: Medicaid Other

## 2016-05-11 ENCOUNTER — Encounter: Payer: Self-pay | Admitting: Obstetrics

## 2016-05-11 ENCOUNTER — Ambulatory Visit (INDEPENDENT_AMBULATORY_CARE_PROVIDER_SITE_OTHER): Payer: BC Managed Care – PPO | Admitting: Obstetrics

## 2016-05-11 VITALS — BP 115/62 | HR 57 | Wt 189.0 lb

## 2016-05-11 DIAGNOSIS — Z309 Encounter for contraceptive management, unspecified: Secondary | ICD-10-CM

## 2016-05-11 DIAGNOSIS — Z1389 Encounter for screening for other disorder: Secondary | ICD-10-CM | POA: Diagnosis not present

## 2016-05-11 DIAGNOSIS — Z3202 Encounter for pregnancy test, result negative: Secondary | ICD-10-CM | POA: Diagnosis not present

## 2016-05-11 LAB — POCT URINALYSIS DIPSTICK
BILIRUBIN UA: NEGATIVE
Blood, UA: NEGATIVE
GLUCOSE UA: NEGATIVE
KETONES UA: NEGATIVE
Nitrite, UA: NEGATIVE
PH UA: 6
Spec Grav, UA: 1.02
Urobilinogen, UA: NEGATIVE

## 2016-05-11 LAB — POCT URINE PREGNANCY: Preg Test, Ur: POSITIVE — AB

## 2016-05-11 NOTE — Progress Notes (Signed)
Subjective:    Patricia [name] is a 35 y.o. female who presents for contraception counseling. The patient has no complaints today.  She presents for contraceptive management but UPT is positive. The patient is sexually active. Pertinent past medical history: none.  The information documented in the HPI was reviewed and verified.  Menstrual History: OB History    Gravida Para Term Preterm AB TAB SAB Ectopic Multiple Living   1 1 1  0 0 0 0 0 0 1       No LMP recorded. Patient has had an injection.   Patient Active Problem List   Diagnosis Date Noted  . Hemorrhoids in the puerperium 03/30/2014  . Normal delivery 02/18/2014  . Pregnancy 02/17/2014  . Hemorrhoids complicating pregnancy or puerperium 09/25/2013  . Constipation in pregnancy in second trimester 09/09/2013  . Unspecified vitamin D deficiency 08/22/2013  . Supervision of normal first pregnancy 08/19/2013  . History of abnormal Pap smear 08/19/2013  . Uterine fibroid 08/19/2013   Past Medical History  Diagnosis Date  . Medical history non-contributory     Past Surgical History  Procedure Laterality Date  . No past surgeries      No current outpatient prescriptions on file. Allergies  Allergen Reactions  . Penicillins Anaphylaxis    Social History  Substance Use Topics  . Smoking status: Never Smoker   . Smokeless tobacco: Not on file  . Alcohol Use: No     Comment: occ    No family history on file.     Review of Systems Constitutional: negative for weight loss Genitourinary:negative for abnormal menstrual periods and vaginal discharge   Objective:   BP 115/62 mmHg  Pulse 57  Wt 189 lb (85.73 kg)   Lab Review Urine pregnancy test Labs reviewed yes Radiologic studies reviewed no  PE:  Deferred  100% of 10 min visit spent on counseling and coordination of care.   Assessment:    35 y.o., presenting for contraceptive management but is pregnant.  Plan:    All questions  answered. Follow up as needed.    No orders of the defined types were placed in this encounter.   Orders Placed This Encounter  Procedures  . POCT urinalysis dipstick  . POCT urine pregnancy

## 2019-09-04 ENCOUNTER — Emergency Department (HOSPITAL_COMMUNITY): Payer: BC Managed Care – PPO

## 2019-09-04 ENCOUNTER — Other Ambulatory Visit: Payer: Self-pay

## 2019-09-04 ENCOUNTER — Encounter (HOSPITAL_COMMUNITY): Payer: Self-pay

## 2019-09-04 ENCOUNTER — Emergency Department (HOSPITAL_COMMUNITY)
Admission: EM | Admit: 2019-09-04 | Discharge: 2019-09-04 | Disposition: A | Discharge status: Home / Self Care | Payer: BC Managed Care – PPO | Attending: Emergency Medicine | Admitting: Emergency Medicine

## 2019-09-04 ENCOUNTER — Emergency Department (HOSPITAL_BASED_OUTPATIENT_CLINIC_OR_DEPARTMENT_OTHER): Payer: BC Managed Care – PPO

## 2019-09-04 DIAGNOSIS — R079 Chest pain, unspecified: Secondary | ICD-10-CM | POA: Diagnosis present

## 2019-09-04 DIAGNOSIS — M79604 Pain in right leg: Secondary | ICD-10-CM

## 2019-09-04 HISTORY — DX: Reserved for concepts with insufficient information to code with codable children: IMO0002

## 2019-09-04 HISTORY — DX: Unspecified disorder of eye and adnexa: H57.9

## 2019-09-04 HISTORY — DX: Cardiac murmur, unspecified: R01.1

## 2019-09-04 LAB — CBC
HCT: 40.2 % (ref 36.0–46.0)
Hemoglobin: 13.4 g/dL (ref 12.0–15.0)
MCH: 31.6 pg (ref 26.0–34.0)
MCHC: 33.3 g/dL (ref 30.0–36.0)
MCV: 94.8 fL (ref 80.0–100.0)
Platelets: 272 10*3/uL (ref 150–400)
RBC: 4.24 MIL/uL (ref 3.87–5.11)
RDW: 12.5 % (ref 11.5–15.5)
WBC: 5.7 10*3/uL (ref 4.0–10.5)
nRBC: 0 % (ref 0.0–0.2)

## 2019-09-04 LAB — I-STAT BETA HCG BLOOD, ED (NOT ORDERABLE): I-stat hCG, quantitative: <5 m[IU]/mL (ref <5)

## 2019-09-04 LAB — TROPONIN I (HIGH SENSITIVITY)
Troponin I (High Sensitivity): 9 ng/L (ref <18)
Troponin I (High Sensitivity): 8 ng/L (ref <18)

## 2019-09-04 LAB — BASIC METABOLIC PANEL
Anion gap: 7 (ref 5–15)
BUN: 17 mg/dL (ref 6–20)
CO2: 19 mmol/L — ABNORMAL LOW (ref 22–32)
Calcium: 9 mg/dL (ref 8.9–10.3)
Chloride: 111 mmol/L (ref 98–111)
Creatinine, Ser: 1.16 mg/dL — ABNORMAL HIGH (ref 0.44–1.00)
GFR calc Af Amer: >60 mL/min (ref >60)
GFR calc non Af Amer: >60 mL/min (ref >60)
Glucose, Bld: 99 mg/dL (ref 70–99)
Potassium: 3.9 mmol/L (ref 3.5–5.1)
Sodium: 137 mmol/L (ref 135–145)

## 2019-09-04 LAB — D-DIMER, QUANTITATIVE: D-Dimer, Quant: 0.34 ug/mL-FEU (ref 0.00–0.50)

## 2019-09-04 MED ORDER — SODIUM CHLORIDE 0.9% FLUSH: 3.0000 mL | Freq: Once | INTRAVENOUS | Status: DC

## 2019-09-04 NOTE — ED Notes (Signed)
Pt was sitting outside and now in lobby. Pt brought to fast track room and instructed to get into gown and take pants off for vascular tech to come scan her leg.

## 2019-09-04 NOTE — Progress Notes (Signed)
RLE venous duplex       has been completed. Preliminary results can be found under CV proc through chart review. Danetra Glock, BS, RDMS, RVT   

## 2019-09-04 NOTE — ED Triage Notes (Signed)
Patient states at 1415 she was driving and began having chest pain, right leg pain and states she felt an eerie feeling. Patient states she called her husband and when he arrived she began vomiting.  Patient states she has been having right leg pain > 1 year and she saw her PCP 6 days ago and then she had an x-ray right knee 3 days ago. Patient states she did have swelling to the right knee when she saw her PCP which was new.

## 2019-09-04 NOTE — ED Provider Notes (Signed)
[facility] DEPT Provider Note   CSN: [id number] Arrival date & time: 09/04/19  1503     History   Chief Complaint Chief Complaint  Patient presents with   Chest Pain   Leg Pain   Emesis    HPI Patricia [name] is a 38 y.o. female.     HPI 38 year old female with past medical history significant for heart murmur and migraines presents to the ER for evaluation of chest pain.  Patient states that approximately 1400 this afternoon she was driving when she got a dull pressure in the center of her chest.  It did not radiate.  Lasted for approximately 15 minutes and self resolved.  Patient states at that time she "had an eerie feeling".  She cannot describe this further but states that something does not feel right.  She denies any associated shortness of breath.  She states that the chest pain did not radiate.  Denies any associated diaphoresis but does report some nausea and vomiting.  She states that she was having pain to her right leg.  She reports having right leg pain for the past year on and off.  Saw her primary care doctor last week where an x-ray was performed that was normal.  They suggest that she may have a meniscal tear.  Patient states it feels like a charley horse in her right calf.  She denies any history of PE/DVT, prolonged immobilization, recent hospitalization/surgeries, unilateral leg swelling or calf tenderness, hemoptysis.  Denies any OCP use.  Patient denies any tobacco use.  Patient denies any cardiac history.  No history of diabetes, high blood pressure high cholesterol.  Denies any family history of early cardiac disease.  She states that she is feeling much better now.  She took no medications for symptoms prior to arrival.  She reports having a history of this in the past last year.  That at that time she was seen in the ER with normal cardiac work-up.  She asked that a stress test in the outpatient setting that was normal as  well.  And they told her that it was possibly anxiety causing her symptoms. Past Medical History:  Diagnosis Date   Chronic migraine    Eye pressure    Heart murmur    Medical history non-contributory     Patient Active Problem List   Diagnosis Date Noted   Hemorrhoids in the puerperium 03/30/2014   Normal delivery 02/18/2014   Pregnancy 02/17/2014   Hemorrhoids complicating pregnancy or puerperium 09/25/2013   Constipation in pregnancy in second trimester 09/09/2013   Unspecified vitamin D deficiency 08/22/2013   Supervision of normal first pregnancy 08/19/2013   History of abnormal Pap smear 08/19/2013   Uterine fibroid 08/19/2013    Past Surgical History:  Procedure Laterality Date   NO PAST SURGERIES       OB History    Gravida  1   Para  1   Term  1   Preterm  0   AB  0   Living  1     SAB  0   TAB  0   Ectopic  0   Multiple  0   Live Births  1            Home Medications    Prior to Admission medications   Not on File    Family History Family History  Problem Relation Age of Onset   Hypertension Mother    Osteoarthritis Mother  Social History Social History   Tobacco Use   Smoking status: Never Smoker   Smokeless tobacco: Never Used  Substance Use Topics   Alcohol use: Yes    Alcohol/week: 0.0 standard drinks    Comment: occ   Drug use: No     Allergies   Penicillins   Review of Systems Review of Systems  Constitutional: Negative for chills, diaphoresis, fatigue and fever.  Eyes: Positive for discharge.  Respiratory: Positive for shortness of breath.   Cardiovascular: Positive for chest pain and leg swelling. Negative for palpitations.  Gastrointestinal: Positive for nausea and vomiting. Negative for abdominal pain and diarrhea.  Genitourinary: Negative for hematuria.  Musculoskeletal: Negative for myalgias.  Skin: Negative for color change.  Neurological: Positive for headaches.    Psychiatric/Behavioral: Negative for confusion.     Physical Exam Updated Vital Signs BP 131/86 (BP Location: Left Arm)    Pulse (!) 58    Temp 98.3 F (36.8 C) (Oral)    Resp 19    Ht 5\' 6"  (1.676 m)    Wt 83.9 kg    LMP 08/15/2019 (Approximate)    SpO2 100%    BMI 29.86 kg/m   Physical Exam Vitals signs and nursing note reviewed.  Constitutional:      General: She is not in acute distress.    Appearance: She is well-developed. She is not toxic-appearing or diaphoretic.  HENT:     Head: Normocephalic and atraumatic.     Nose: Nose normal.  Eyes:     General:        Right eye: No discharge.        Left eye: No discharge.     Conjunctiva/sclera: Conjunctivae normal.     Pupils: Pupils are equal, round, and reactive to light.  Neck:     Musculoskeletal: Normal range of motion and neck supple.     Vascular: No JVD.     Trachea: No tracheal deviation.  Cardiovascular:     Rate and Rhythm: Normal rate and regular rhythm.     Pulses:          Radial pulses are 2+ on the right side and 2+ on the left side.       Dorsalis pedis pulses are 2+ on the right side and 2+ on the left side.     Heart sounds: Normal heart sounds. No murmur. No friction rub. No gallop.   Pulmonary:     Effort: Pulmonary effort is normal. No respiratory distress.     Breath sounds: Normal breath sounds. No decreased breath sounds, wheezing, rhonchi or rales.  Chest:     Chest wall: No tenderness.  Abdominal:     General: Bowel sounds are normal. There is no distension.     Palpations: Abdomen is soft.     Tenderness: There is no abdominal tenderness. There is no guarding or rebound.  Musculoskeletal: Normal range of motion.     Right lower leg: She exhibits tenderness. No edema.     Left lower leg: She exhibits no tenderness. No edema.     Comments: No lower extremity edema or calf tenderness.  Lymphadenopathy:     Cervical: No cervical adenopathy.  Skin:    General: Skin is warm and dry.      Capillary Refill: Capillary refill takes less than 2 seconds.  Neurological:     Mental Status: She is alert and oriented to person, place, and time.  Psychiatric:  Behavior: Behavior normal.        Thought Content: Thought content normal.        Judgment: Judgment normal.      ED Treatments / Results  Labs (all labs ordered are listed, but only abnormal results are displayed) Labs Reviewed  BASIC METABOLIC PANEL - Abnormal; Notable for the following components:      Result Value   CO2 19 (*)    Creatinine, Ser 1.16 (*)    All other components within normal limits  CBC  D-DIMER, QUANTITATIVE (NOT AT [facility])  I-STAT BETA HCG BLOOD, ED ([facility], [facility], [facility] ONLY)  I-STAT BETA HCG BLOOD, ED (NOT ORDERABLE)  TROPONIN I (HIGH SENSITIVITY)  TROPONIN I (HIGH SENSITIVITY)  TROPONIN I (HIGH SENSITIVITY)    EKG None  Radiology Dg Chest 2 View  Result Date: 09/04/2019 CLINICAL DATA:  Chest pain EXAM: CHEST - 2 VIEW COMPARISON:  March 11, 2019 FINDINGS: Lungs are clear. Heart size and pulmonary vascularity are normal. No adenopathy. No pneumothorax. No bone lesions. IMPRESSION: No edema or consolidation. Electronically Signed   By: Lowella Grip III M.D.   On: 09/04/2019 15:54   Vas [place] Lower Extremity Venous (dvt) (only [facility])  Result Date: 09/04/2019  Lower Venous Study Indications: Calf pain x 1 year, new onset knee swelling.  Limitations: Suboptimal quality due to room condition and patient in chair for exam. Comparison Study: no prior Performing Technologist: June Leap RDMS, RVT  Examination Guidelines: A complete evaluation includes B-mode imaging, spectral Doppler, color Doppler, and power Doppler as needed of all accessible portions of each vessel. Bilateral testing is considered an integral part of a complete examination. Limited examinations for reoccurring indications may be performed as noted.  +---------+---------------+---------+-----------+----------+--------------+   RIGHT     Compressibility Phasicity Spontaneity Properties Thrombus Aging  +---------+---------------+---------+-----------+----------+--------------+  CFV       Full            Yes       Yes                                    +---------+---------------+---------+-----------+----------+--------------+  SFJ       Full                                                             +---------+---------------+---------+-----------+----------+--------------+  FV Prox   Full                                                             +---------+---------------+---------+-----------+----------+--------------+  FV Mid    Full                                                             +---------+---------------+---------+-----------+----------+--------------+  FV Distal Full                                                             +---------+---------------+---------+-----------+----------+--------------+  PFV       Full                                                             +---------+---------------+---------+-----------+----------+--------------+  POP       Full                                                             +---------+---------------+---------+-----------+----------+--------------+  PTV       Full                                                             +---------+---------------+---------+-----------+----------+--------------+  PERO      Full                                                             +---------+---------------+---------+-----------+----------+--------------+     Summary: Right: There is no evidence of deep vein thrombosis in the lower extremity. No cystic structure found in the popliteal fossa.  *See table(s) above for measurements and observations.    Preliminary     Procedures Procedures (including critical care time)  Medications Ordered in ED Medications  sodium chloride flush (NS) 0.9 % injection 3 mL (has no administration in time range)     Initial Impression /  Assessment and Plan / ED Course  I have reviewed the triage vital signs and the nursing notes.  Pertinent labs & imaging results that were available during my care of the patient were reviewed by me and considered in my medical decision making (see chart for details).        Pt presents to the Ed today with complaints of cp. Patient is to be discharged with recommendation to follow up with PCP in regards to today's hospital visit. Chest pain is not likely of cardiac or pulmonary etiology d/t presentation, perc negative/d-dimer negative, VSS, no tracheal deviation, no JVD or new murmur, RRR, breath sounds equal bilaterally, EKG shows normal sinus rhythm without any signs of acute ischemic changes, negative delta troponin, and negative CXR.  Heart pathway score is 2 and patient is low risk for cardiac disease.  Work-up reassuring in the ER.  Labs reviewed by myself are reassuring.  Unknown cause of patient's symptoms but doubt PE, ACS, dissection, myocarditis or pericarditis.  Patient will need close follow with primary care doctor in the outpatient setting.  Symptoms are completely resolved since being in the ER without any intervention.  Pt has been advised to return to the ED is CP becomes exertional, associated with diaphoresis or nausea, radiates to left jaw/arm, worsens or becomes concerning in any way. Pt appears reliable for follow up and is agreeable to discharge.    Final Clinical  Impressions(s) / ED Diagnoses   Final diagnoses:  Chest pain, unspecified type  Right leg pain    ED Discharge Orders    None       Aaron Edelman 09/04/19 1912    Lacretia Leigh, MD 09/08/19 1221

## 2019-09-04 NOTE — ED Notes (Signed)
ED Provider at bedside. 

## 2019-09-04 NOTE — ED Notes (Signed)
[facility]ascular called to come see patient. Called patient no answer

## 2019-09-04 NOTE — Discharge Instructions (Signed)
Your work-up has been reassuring in the emergency department today.  Unknown cause of your symptoms but no signs of a blood clot or heart attack.  I would continue drink plenty of fluids stay hydrated.  Motrin and Tylenol for pain of the right leg.  I would recommend following up with your primary care doctor in the outpatient setting for ongoing symptoms as you may need further work-up of your heart with a stress test.  If symptoms worsen you can return to the ER.

## 2019-09-04 NOTE — ED Notes (Signed)
No answer from lobby  

## 2019-09-22 ENCOUNTER — Other Ambulatory Visit: Payer: Self-pay

## 2019-09-22 DIAGNOSIS — Z20822 Contact with and (suspected) exposure to covid-19: Secondary | ICD-10-CM

## 2019-09-23 LAB — NOVEL CORONAVIRUS, NAA: SARS-CoV-2, NAA: NOT DETECTED
